# Patient Record
Sex: Female | Born: 1990 | Race: White | Hispanic: No | Marital: Single | Smoking: Current every day smoker
Health system: Southern US, Community
[De-identification: ages and names within clinical notes are randomized; demographics above are authoritative.]

## PROBLEM LIST (undated history)

## (undated) DIAGNOSIS — B1920 Unspecified viral hepatitis C without hepatic coma: Secondary | ICD-10-CM

## (undated) DIAGNOSIS — F1910 Other psychoactive substance abuse, uncomplicated: Secondary | ICD-10-CM

## (undated) DIAGNOSIS — J45909 Unspecified asthma, uncomplicated: Secondary | ICD-10-CM

---

## 2019-06-10 ENCOUNTER — Emergency Department (HOSPITAL_COMMUNITY): Payer: Medicaid - Out of State

## 2019-06-10 ENCOUNTER — Other Ambulatory Visit: Payer: Self-pay

## 2019-06-10 ENCOUNTER — Encounter (HOSPITAL_COMMUNITY): Payer: Self-pay | Admitting: Emergency Medicine

## 2019-06-10 ENCOUNTER — Other Ambulatory Visit: Payer: Self-pay | Admitting: Neurosurgery

## 2019-06-10 ENCOUNTER — Inpatient Hospital Stay (HOSPITAL_COMMUNITY)
Admission: EM | Admit: 2019-06-10 | Discharge: 2019-06-28 | DRG: 981 | Disposition: A | Discharge status: Home Health | Payer: Medicaid - Out of State | Attending: General Surgery | Admitting: General Surgery

## 2019-06-10 DIAGNOSIS — B1920 Unspecified viral hepatitis C without hepatic coma: Secondary | ICD-10-CM | POA: Diagnosis present

## 2019-06-10 DIAGNOSIS — Y92410 Unspecified street and highway as the place of occurrence of the external cause: Secondary | ICD-10-CM

## 2019-06-10 DIAGNOSIS — J189 Pneumonia, unspecified organism: Secondary | ICD-10-CM | POA: Diagnosis not present

## 2019-06-10 DIAGNOSIS — S36116A Major laceration of liver, initial encounter: Secondary | ICD-10-CM | POA: Diagnosis present

## 2019-06-10 DIAGNOSIS — D62 Acute posthemorrhagic anemia: Secondary | ICD-10-CM | POA: Diagnosis present

## 2019-06-10 DIAGNOSIS — E876 Hypokalemia: Secondary | ICD-10-CM | POA: Diagnosis not present

## 2019-06-10 DIAGNOSIS — R509 Fever, unspecified: Secondary | ICD-10-CM

## 2019-06-10 DIAGNOSIS — F418 Other specified anxiety disorders: Secondary | ICD-10-CM | POA: Diagnosis present

## 2019-06-10 DIAGNOSIS — S2241XA Multiple fractures of ribs, right side, initial encounter for closed fracture: Secondary | ICD-10-CM | POA: Diagnosis present

## 2019-06-10 DIAGNOSIS — Z20822 Contact with and (suspected) exposure to covid-19: Secondary | ICD-10-CM | POA: Diagnosis present

## 2019-06-10 DIAGNOSIS — Z4659 Encounter for fitting and adjustment of other gastrointestinal appliance and device: Secondary | ICD-10-CM

## 2019-06-10 DIAGNOSIS — I959 Hypotension, unspecified: Secondary | ICD-10-CM | POA: Diagnosis not present

## 2019-06-10 DIAGNOSIS — N390 Urinary tract infection, site not specified: Secondary | ICD-10-CM | POA: Diagnosis not present

## 2019-06-10 DIAGNOSIS — M48061 Spinal stenosis, lumbar region without neurogenic claudication: Secondary | ICD-10-CM | POA: Diagnosis present

## 2019-06-10 DIAGNOSIS — K567 Ileus, unspecified: Secondary | ICD-10-CM

## 2019-06-10 DIAGNOSIS — R202 Paresthesia of skin: Secondary | ICD-10-CM | POA: Diagnosis not present

## 2019-06-10 DIAGNOSIS — T1490XA Injury, unspecified, initial encounter: Secondary | ICD-10-CM

## 2019-06-10 DIAGNOSIS — J9811 Atelectasis: Secondary | ICD-10-CM | POA: Diagnosis not present

## 2019-06-10 DIAGNOSIS — J9690 Respiratory failure, unspecified, unspecified whether with hypoxia or hypercapnia: Secondary | ICD-10-CM

## 2019-06-10 DIAGNOSIS — S52552A Other extraarticular fracture of lower end of left radius, initial encounter for closed fracture: Secondary | ICD-10-CM | POA: Diagnosis present

## 2019-06-10 DIAGNOSIS — K661 Hemoperitoneum: Secondary | ICD-10-CM | POA: Diagnosis present

## 2019-06-10 DIAGNOSIS — S52612A Displaced fracture of left ulna styloid process, initial encounter for closed fracture: Secondary | ICD-10-CM | POA: Diagnosis present

## 2019-06-10 DIAGNOSIS — S52502A Unspecified fracture of the lower end of left radius, initial encounter for closed fracture: Secondary | ICD-10-CM

## 2019-06-10 DIAGNOSIS — B9620 Unspecified Escherichia coli [E. coli] as the cause of diseases classified elsewhere: Secondary | ICD-10-CM | POA: Diagnosis not present

## 2019-06-10 DIAGNOSIS — Z419 Encounter for procedure for purposes other than remedying health state, unspecified: Secondary | ICD-10-CM

## 2019-06-10 DIAGNOSIS — S0003XA Contusion of scalp, initial encounter: Secondary | ICD-10-CM | POA: Diagnosis present

## 2019-06-10 DIAGNOSIS — J90 Pleural effusion, not elsewhere classified: Secondary | ICD-10-CM

## 2019-06-10 DIAGNOSIS — F17210 Nicotine dependence, cigarettes, uncomplicated: Secondary | ICD-10-CM | POA: Diagnosis present

## 2019-06-10 DIAGNOSIS — S32012A Unstable burst fracture of first lumbar vertebra, initial encounter for closed fracture: Secondary | ICD-10-CM | POA: Diagnosis present

## 2019-06-10 DIAGNOSIS — Z9119 Patient's noncompliance with other medical treatment and regimen: Secondary | ICD-10-CM | POA: Diagnosis not present

## 2019-06-10 DIAGNOSIS — J9601 Acute respiratory failure with hypoxia: Secondary | ICD-10-CM | POA: Diagnosis not present

## 2019-06-10 DIAGNOSIS — F1990 Other psychoactive substance use, unspecified, uncomplicated: Secondary | ICD-10-CM | POA: Diagnosis present

## 2019-06-10 DIAGNOSIS — J939 Pneumothorax, unspecified: Secondary | ICD-10-CM

## 2019-06-10 DIAGNOSIS — Z01818 Encounter for other preprocedural examination: Secondary | ICD-10-CM

## 2019-06-10 DIAGNOSIS — S36031A Moderate laceration of spleen, initial encounter: Secondary | ICD-10-CM | POA: Diagnosis present

## 2019-06-10 HISTORY — DX: Other psychoactive substance abuse, uncomplicated: F19.10

## 2019-06-10 HISTORY — DX: Unspecified asthma, uncomplicated: J45.909

## 2019-06-10 HISTORY — DX: Unspecified viral hepatitis C without hepatic coma: B19.20

## 2019-06-10 LAB — CBC
HCT: 33.5 % — ABNORMAL LOW (ref 36.0–46.0)
HCT: 22.9 % — ABNORMAL LOW (ref 36.0–46.0)
Hemoglobin: 11 g/dL — ABNORMAL LOW (ref 12.0–15.0)
Hemoglobin: 7.8 g/dL — ABNORMAL LOW (ref 12.0–15.0)
MCH: 30 pg (ref 26.0–34.0)
MCH: 30.6 pg (ref 26.0–34.0)
MCHC: 32.8 g/dL (ref 30.0–36.0)
MCHC: 34.1 g/dL (ref 30.0–36.0)
MCV: 91.3 fL (ref 80.0–100.0)
MCV: 89.8 fL (ref 80.0–100.0)
Platelets: 306 10*3/uL (ref 150–400)
Platelets: 171 10*3/uL (ref 150–400)
RBC: 3.67 MIL/uL — ABNORMAL LOW (ref 3.87–5.11)
RBC: 2.55 MIL/uL — ABNORMAL LOW (ref 3.87–5.11)
RDW: 14.2 % (ref 11.5–15.5)
RDW: 14.5 % (ref 11.5–15.5)
WBC: 9 10*3/uL (ref 4.0–10.5)
WBC: 6.9 10*3/uL (ref 4.0–10.5)
nRBC: 0 % (ref 0.0–0.2)
nRBC: 0 % (ref 0.0–0.2)

## 2019-06-10 LAB — COMPREHENSIVE METABOLIC PANEL
ALT: 330 U/L — ABNORMAL HIGH (ref 0–44)
AST: 602 U/L — ABNORMAL HIGH (ref 15–41)
Albumin: 3.1 g/dL — ABNORMAL LOW (ref 3.5–5.0)
Alkaline Phosphatase: 51 U/L (ref 38–126)
Anion gap: 11 (ref 5–15)
BUN: 9 mg/dL (ref 6–20)
CO2: 21 mmol/L — ABNORMAL LOW (ref 22–32)
Calcium: 8.3 mg/dL — ABNORMAL LOW (ref 8.9–10.3)
Chloride: 109 mmol/L (ref 98–111)
Creatinine, Ser: 0.67 mg/dL (ref 0.44–1.00)
GFR calc Af Amer: >60 mL/min (ref >60)
GFR calc non Af Amer: >60 mL/min (ref >60)
Glucose, Bld: 174 mg/dL — ABNORMAL HIGH (ref 70–99)
Potassium: 3.3 mmol/L — ABNORMAL LOW (ref 3.5–5.1)
Sodium: 141 mmol/L (ref 135–145)
Total Bilirubin: 0.5 mg/dL (ref 0.3–1.2)
Total Protein: 6.3 g/dL — ABNORMAL LOW (ref 6.5–8.1)

## 2019-06-10 LAB — I-STAT CHEM 8, ED
BUN: 8 mg/dL (ref 6–20)
Calcium, Ion: 1 mmol/L — ABNORMAL LOW (ref 1.15–1.40)
Chloride: 108 mmol/L (ref 98–111)
Creatinine, Ser: 0.6 mg/dL (ref 0.44–1.00)
Glucose, Bld: 165 mg/dL — ABNORMAL HIGH (ref 70–99)
HCT: 32 % — ABNORMAL LOW (ref 36.0–46.0)
Hemoglobin: 10.9 g/dL — ABNORMAL LOW (ref 12.0–15.0)
Potassium: 3.6 mmol/L (ref 3.5–5.1)
Sodium: 143 mmol/L (ref 135–145)
TCO2: 21 mmol/L — ABNORMAL LOW (ref 22–32)

## 2019-06-10 LAB — I-STAT BETA HCG BLOOD, ED (MC, WL, AP ONLY): I-stat hCG, quantitative: <5 m[IU]/mL (ref <5)

## 2019-06-10 LAB — ETHANOL: Alcohol, Ethyl (B): <10 mg/dL

## 2019-06-10 LAB — SAMPLE TO BLOOD BANK

## 2019-06-10 LAB — PROTIME-INR
INR: 1.2 (ref 0.8–1.2)
Prothrombin Time: 15 seconds (ref 11.4–15.2)

## 2019-06-10 LAB — MRSA PCR SCREENING: MRSA by PCR: POSITIVE — AB

## 2019-06-10 LAB — SARS CORONAVIRUS 2 BY RT PCR (HOSPITAL ORDER, PERFORMED IN ~~LOC~~ HOSPITAL LAB): SARS Coronavirus 2: NEGATIVE

## 2019-06-10 LAB — LACTIC ACID, PLASMA: Lactic Acid, Venous: 1.5 mmol/L (ref 0.5–1.9)

## 2019-06-10 SURGERY — Surgical Case
Anesthesia: *Unknown

## 2019-06-10 MED ORDER — HYDROMORPHONE HCL 1 MG/ML IJ SOLN
0.5000 mg | INTRAMUSCULAR | Status: DC | PRN
  Administered 2019-06-11: 0.5 mg via INTRAVENOUS
  Filled 2019-06-10: qty 1

## 2019-06-10 MED ORDER — DEXMEDETOMIDINE HCL IN NACL 400 MCG/100ML IV SOLN
0.4000 ug/kg/h | INTRAVENOUS | Status: DC
  Administered 2019-06-11 – 2019-06-12 (×3): 0.4 ug/kg/h via INTRAVENOUS
  Administered 2019-06-12 – 2019-06-13 (×2): 1.2 ug/kg/h via INTRAVENOUS
  Administered 2019-06-13 – 2019-06-14 (×3): 1 ug/kg/h via INTRAVENOUS
  Administered 2019-06-14 – 2019-06-15 (×2): 1.2 ug/kg/h via INTRAVENOUS
  Administered 2019-06-15: 0.8 ug/kg/h via INTRAVENOUS
  Administered 2019-06-15 – 2019-06-16 (×2): 1.2 ug/kg/h via INTRAVENOUS
  Administered 2019-06-16 (×2): 1 ug/kg/h via INTRAVENOUS
  Administered 2019-06-17 (×3): 1.2 ug/kg/h via INTRAVENOUS
  Administered 2019-06-17: 1 ug/kg/h via INTRAVENOUS
  Administered 2019-06-18: 1.2 ug/kg/h via INTRAVENOUS
  Administered 2019-06-18: 1 ug/kg/h via INTRAVENOUS
  Filled 2019-06-10: qty 100
  Filled 2019-06-10: qty 300
  Filled 2019-06-10 (×12): qty 100
  Filled 2019-06-10: qty 300
  Filled 2019-06-10 (×2): qty 100
  Filled 2019-06-10: qty 200
  Filled 2019-06-10 (×6): qty 100

## 2019-06-10 MED ORDER — HALOPERIDOL LACTATE 5 MG/ML IJ SOLN: 5.0000 mg | Freq: Four times a day (QID) | INTRAMUSCULAR | Status: DC | PRN

## 2019-06-10 MED ORDER — TETANUS-DIPHTH-ACELL PERTUSSIS 5-2.5-18.5 LF-MCG/0.5 IM SUSP
0.5000 mL | Freq: Once | INTRAMUSCULAR | Status: AC
  Administered 2019-06-10: 0.5 mL via INTRAMUSCULAR
  Filled 2019-06-10: qty 0.5

## 2019-06-10 MED ORDER — SODIUM CHLORIDE 0.9 % IV BOLUS
1000.0000 mL | Freq: Once | INTRAVENOUS | Status: AC
  Administered 2019-06-10: 1000 mL via INTRAVENOUS

## 2019-06-10 MED ORDER — OXYCODONE HCL 5 MG PO TABS
5.0000 mg | ORAL_TABLET | ORAL | Status: DC | PRN
  Administered 2019-06-11 – 2019-06-17 (×3): 5 mg via ORAL
  Filled 2019-06-10 (×4): qty 1

## 2019-06-10 MED ORDER — OXYCODONE HCL 5 MG PO TABS
10.0000 mg | ORAL_TABLET | ORAL | Status: DC | PRN
  Administered 2019-06-11 – 2019-06-18 (×6): 10 mg via ORAL
  Filled 2019-06-10 (×7): qty 2

## 2019-06-10 MED ORDER — MUPIROCIN 2 % EX OINT
1.0000 "application " | TOPICAL_OINTMENT | Freq: Two times a day (BID) | CUTANEOUS | Status: AC
  Administered 2019-06-10 – 2019-06-15 (×10): 1 via NASAL
  Filled 2019-06-10: qty 22

## 2019-06-10 MED ORDER — IOHEXOL 300 MG/ML  SOLN
100.0000 mL | Freq: Once | INTRAMUSCULAR | Status: AC | PRN
  Administered 2019-06-10: 100 mL via INTRAVENOUS

## 2019-06-10 MED ORDER — LACTATED RINGERS IV SOLN: INTRAVENOUS | Status: DC

## 2019-06-10 MED ORDER — METHOCARBAMOL 1000 MG/10ML IJ SOLN
1000.0000 mg | Freq: Three times a day (TID) | INTRAVENOUS | Status: DC
  Administered 2019-06-10 – 2019-06-21 (×32): 1000 mg via INTRAVENOUS
  Filled 2019-06-10 (×38): qty 10

## 2019-06-10 MED ORDER — ONDANSETRON HCL 4 MG/2ML IJ SOLN
4.0000 mg | Freq: Once | INTRAMUSCULAR | Status: AC
  Administered 2019-06-10: 4 mg via INTRAVENOUS
  Filled 2019-06-10: qty 2

## 2019-06-10 MED ORDER — SODIUM CHLORIDE 0.9 % IV SOLN: INTRAVENOUS | Status: DC

## 2019-06-10 MED ORDER — ONDANSETRON 4 MG PO TBDP: 4.0000 mg | ORAL_TABLET | Freq: Four times a day (QID) | ORAL | Status: DC | PRN

## 2019-06-10 MED ORDER — METOPROLOL TARTRATE 5 MG/5ML IV SOLN: 5.0000 mg | Freq: Four times a day (QID) | INTRAVENOUS | Status: DC | PRN

## 2019-06-10 MED ORDER — HYDROMORPHONE HCL 1 MG/ML IJ SOLN
2.0000 mg | INTRAMUSCULAR | Status: DC | PRN
  Administered 2019-06-10 – 2019-06-17 (×14): 2 mg via INTRAVENOUS
  Administered 2019-06-17: 1 mg via INTRAVENOUS
  Administered 2019-06-17 – 2019-06-18 (×5): 2 mg via INTRAVENOUS
  Filled 2019-06-10 (×20): qty 2

## 2019-06-10 MED ORDER — ALPRAZOLAM 0.5 MG PO TABS: 1.0000 mg | ORAL_TABLET | Freq: Two times a day (BID) | ORAL | Status: DC

## 2019-06-10 MED ORDER — HYDROMORPHONE HCL 1 MG/ML IJ SOLN
0.5000 mg | Freq: Once | INTRAMUSCULAR | Status: AC
  Administered 2019-06-10: 0.5 mg via INTRAVENOUS
  Filled 2019-06-10: qty 1

## 2019-06-10 MED ORDER — POTASSIUM CHLORIDE 10 MEQ/100ML IV SOLN
10.0000 meq | INTRAVENOUS | Status: AC
  Administered 2019-06-10 (×2): 10 meq via INTRAVENOUS
  Filled 2019-06-10: qty 100

## 2019-06-10 MED ORDER — HYDROMORPHONE HCL 1 MG/ML IJ SOLN
1.0000 mg | INTRAMUSCULAR | Status: DC | PRN
  Administered 2019-06-11 – 2019-06-17 (×7): 1 mg via INTRAVENOUS
  Filled 2019-06-10 (×10): qty 1

## 2019-06-10 MED ORDER — TRAMADOL HCL 50 MG PO TABS
50.0000 mg | ORAL_TABLET | Freq: Four times a day (QID) | ORAL | Status: DC | PRN
  Administered 2019-06-11 (×3): 50 mg via ORAL
  Filled 2019-06-10 (×3): qty 1

## 2019-06-10 MED ORDER — PANTOPRAZOLE SODIUM 40 MG IV SOLR: 40.0000 mg | Freq: Every day | INTRAVENOUS | Status: DC

## 2019-06-10 MED ORDER — DOCUSATE SODIUM 100 MG PO CAPS
100.0000 mg | ORAL_CAPSULE | Freq: Two times a day (BID) | ORAL | Status: DC
  Administered 2019-06-11 – 2019-06-12 (×3): 100 mg via ORAL
  Filled 2019-06-10 (×3): qty 1

## 2019-06-10 MED ORDER — ONDANSETRON HCL 4 MG/2ML IJ SOLN
4.0000 mg | Freq: Four times a day (QID) | INTRAMUSCULAR | Status: DC | PRN
  Administered 2019-06-11 – 2019-06-24 (×7): 4 mg via INTRAVENOUS
  Filled 2019-06-10 (×6): qty 2

## 2019-06-10 MED ORDER — CHLORHEXIDINE GLUCONATE CLOTH 2 % EX PADS
6.0000 | MEDICATED_PAD | Freq: Every day | CUTANEOUS | Status: AC
  Administered 2019-06-11 – 2019-06-14 (×4): 6 via TOPICAL

## 2019-06-10 MED ORDER — LIDOCAINE HCL 1 % IJ SOLN
10.0000 mL | Freq: Once | INTRAMUSCULAR | Status: AC
  Administered 2019-06-10: 10 mL
  Filled 2019-06-10: qty 10

## 2019-06-10 MED ORDER — PANTOPRAZOLE SODIUM 40 MG PO TBEC: 40.0000 mg | DELAYED_RELEASE_TABLET | Freq: Every day | ORAL | Status: DC

## 2019-06-10 MED ORDER — LORAZEPAM 2 MG/ML IJ SOLN
1.0000 mg | Freq: Four times a day (QID) | INTRAMUSCULAR | Status: DC | PRN
  Administered 2019-06-10 – 2019-06-17 (×7): 1 mg via INTRAVENOUS
  Filled 2019-06-10 (×8): qty 1

## 2019-06-10 NOTE — Progress Notes (Signed)
Orthopedic Tech Progress Note Patient Details:  Kelly Garrett Aug 09, 1990 [id number]  Ortho Devices Type of Ortho Device: Sugartong splint Ortho Device/Splint Location: Left Upper Extremity Ortho Device/Splint Interventions: Ordered, Application   Post Interventions Patient Tolerated: Fair Instructions Provided: Adjustment of device, Care of device, Poper ambulation with device   Gerald Stabs 06/10/2019, 12:53 PM

## 2019-06-10 NOTE — Progress Notes (Signed)
Orthopedic Tech Progress Note Patient Details:  Kelly Garrett 05-28-1990 [id number] Called in patients brace Patient ID: Kelly Garrett, female   DOB: 1990/08/02, 29 y.o.   MRN: [id number]   Kelly Garrett 06/10/2019, 1:45 PM

## 2019-06-10 NOTE — Progress Notes (Signed)
Belongings on admission: One bottle of xanax taken to pharmacy One empty pill bottle I pair earrings 2 silver rings 1 black purse 1 smaller pink purse Phone  Black wallet  Bra Shoes Cash going to security: four 100 dollar bills            One 20 dollar bill           One 10 dollar bill           Three 1 dollar bills             One quarter Other valuables going to security: 2 lighters, 2 vape pens
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
## 2019-06-10 NOTE — ED Notes (Signed)
Paged ortho tech to place long arm splint
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
## 2019-06-10 NOTE — Progress Notes (Signed)
Pt found to be sitting up in bed. Requested patient to lay back in bed explained extent of back injuries and risks associated with moving excessively.
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
## 2019-06-10 NOTE — Plan of Care (Signed)
  Problem: Safety: Goal: Ability to remain free from injury will improve Outcome: Progressing   
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
## 2019-06-10 NOTE — Consult Note (Addendum)
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 patient's cervical CT performed at [facility] today.  She has a mildly straightened cervical spine but no acute findings.  I have also reviewed the patient's CT of the chest abdomen and pelvis only as it pertains to her spine.  The patient has an L1 burst fracture with moderate spinal stenosis.  Assessment/Plan: L1 burst fracture: This is an unstable fracture which will require surgical decompression, instrumentation and fusion.  I discussed the situation with Dr. [facility].  I am reluctant to place the patient under general anesthesia and to turn the patient prone for a few hours for spine surgery now with the known liver lacerations and splenic lacerations.  She will require at least 48 hours of observation in the supine position.  We will plan to do her surgery next week.  In the meantime she can be treated with a clamshell TLSO and logroll/bedrest with the head of bed less than 20 degrees.  I have discussed this  with Dr. [facility] and Dr. Bedelia Person.  I discussed this with the patient and stressed the importance of her following the directions, remaining flat, etc. I have explained to her that she does not follow  our directions she could end up dead or paralyzed.  Kelly Garrett 06/10/2019, 1:32 PM

## 2019-06-11 ENCOUNTER — Inpatient Hospital Stay (HOSPITAL_COMMUNITY): Payer: Medicaid - Out of State

## 2019-06-11 LAB — ABO/RH: ABO/RH(D): A NEG

## 2019-06-11 LAB — COMPREHENSIVE METABOLIC PANEL
ALT: 331 U/L — ABNORMAL HIGH (ref 0–44)
AST: 463 U/L — ABNORMAL HIGH (ref 15–41)
Albumin: 2.7 g/dL — ABNORMAL LOW (ref 3.5–5.0)
Alkaline Phosphatase: 55 U/L (ref 38–126)
Anion gap: 8 (ref 5–15)
BUN: 13 mg/dL (ref 6–20)
CO2: 22 mmol/L (ref 22–32)
Calcium: 7.9 mg/dL — ABNORMAL LOW (ref 8.9–10.3)
Chloride: 107 mmol/L (ref 98–111)
Creatinine, Ser: 0.49 mg/dL (ref 0.44–1.00)
GFR calc Af Amer: >60 mL/min (ref >60)
GFR calc non Af Amer: >60 mL/min (ref >60)
Glucose, Bld: 118 mg/dL — ABNORMAL HIGH (ref 70–99)
Potassium: 3.8 mmol/L (ref 3.5–5.1)
Sodium: 137 mmol/L (ref 135–145)
Total Bilirubin: 1.8 mg/dL — ABNORMAL HIGH (ref 0.3–1.2)
Total Protein: 5.3 g/dL — ABNORMAL LOW (ref 6.5–8.1)

## 2019-06-11 LAB — CBC
HCT: 22.7 % — ABNORMAL LOW (ref 36.0–46.0)
HCT: 25.4 % — ABNORMAL LOW (ref 36.0–46.0)
Hemoglobin: 7.4 g/dL — ABNORMAL LOW (ref 12.0–15.0)
Hemoglobin: 8.6 g/dL — ABNORMAL LOW (ref 12.0–15.0)
MCH: 30.1 pg (ref 26.0–34.0)
MCH: 30.2 pg (ref 26.0–34.0)
MCHC: 32.6 g/dL (ref 30.0–36.0)
MCHC: 33.9 g/dL (ref 30.0–36.0)
MCV: 92.3 fL (ref 80.0–100.0)
MCV: 89.1 fL (ref 80.0–100.0)
Platelets: 154 10*3/uL (ref 150–400)
Platelets: 139 10*3/uL — ABNORMAL LOW (ref 150–400)
RBC: 2.46 MIL/uL — ABNORMAL LOW (ref 3.87–5.11)
RBC: 2.85 MIL/uL — ABNORMAL LOW (ref 3.87–5.11)
RDW: 14.6 % (ref 11.5–15.5)
RDW: 14.6 % (ref 11.5–15.5)
WBC: 6.3 10*3/uL (ref 4.0–10.5)
WBC: 8.6 10*3/uL (ref 4.0–10.5)
nRBC: 0 % (ref 0.0–0.2)
nRBC: 0 % (ref 0.0–0.2)

## 2019-06-11 LAB — URINALYSIS, ROUTINE W REFLEX MICROSCOPIC
Glucose, UA: NEGATIVE mg/dL
Hgb urine dipstick: NEGATIVE
Ketones, ur: NEGATIVE mg/dL
Leukocytes,Ua: NEGATIVE
Nitrite: POSITIVE — AB
Protein, ur: 30 mg/dL — AB
Specific Gravity, Urine: 1.035 — ABNORMAL HIGH (ref 1.005–1.030)
pH: 5 (ref 5.0–8.0)

## 2019-06-11 LAB — RAPID URINE DRUG SCREEN, HOSP PERFORMED
Amphetamines: NOT DETECTED
Barbiturates: NOT DETECTED
Benzodiazepines: POSITIVE — AB
Cocaine: NOT DETECTED
Opiates: POSITIVE — AB
Tetrahydrocannabinol: NOT DETECTED

## 2019-06-11 LAB — HEMOGLOBIN AND HEMATOCRIT, BLOOD
HCT: 26.8 % — ABNORMAL LOW (ref 36.0–46.0)
Hemoglobin: 9.2 g/dL — ABNORMAL LOW (ref 12.0–15.0)

## 2019-06-11 LAB — HIV ANTIBODY (ROUTINE TESTING W REFLEX): HIV Screen 4th Generation wRfx: NONREACTIVE

## 2019-06-11 LAB — PROTIME-INR
INR: 1.1 (ref 0.8–1.2)
Prothrombin Time: 14.2 seconds (ref 11.4–15.2)

## 2019-06-11 LAB — PREPARE RBC (CROSSMATCH)

## 2019-06-11 MED ORDER — CLONIDINE HCL 0.1 MG PO TABS
0.1000 mg | ORAL_TABLET | Freq: Two times a day (BID) | ORAL | Status: DC
  Administered 2019-06-11: 0.1 mg via ORAL
  Filled 2019-06-11 (×3): qty 1

## 2019-06-11 MED ORDER — BACLOFEN 10 MG PO TABS
10.0000 mg | ORAL_TABLET | Freq: Four times a day (QID) | ORAL | Status: DC
  Administered 2019-06-11 – 2019-06-12 (×4): 10 mg via ORAL
  Filled 2019-06-11 (×7): qty 1

## 2019-06-11 MED ORDER — GABAPENTIN 300 MG PO CAPS
600.0000 mg | ORAL_CAPSULE | Freq: Three times a day (TID) | ORAL | Status: DC
  Administered 2019-06-11 – 2019-06-12 (×3): 600 mg via ORAL
  Filled 2019-06-11 (×4): qty 2

## 2019-06-11 MED ORDER — RISPERIDONE 0.5 MG PO TABS
0.5000 mg | ORAL_TABLET | Freq: Two times a day (BID) | ORAL | Status: DC
  Administered 2019-06-11 – 2019-06-12 (×2): 0.5 mg via ORAL
  Filled 2019-06-11 (×4): qty 1

## 2019-06-11 MED ORDER — PHENOBARBITAL 32.4 MG PO TABS: 16.2000 mg | ORAL_TABLET | Freq: Every day | ORAL | Status: DC

## 2019-06-11 MED ORDER — PANTOPRAZOLE SODIUM 40 MG PO TBEC
40.0000 mg | DELAYED_RELEASE_TABLET | Freq: Every day | ORAL | Status: DC
  Administered 2019-06-11 – 2019-06-12 (×2): 40 mg via ORAL
  Filled 2019-06-11 (×2): qty 1

## 2019-06-11 MED ORDER — ALPRAZOLAM 0.5 MG PO TABS
2.0000 mg | ORAL_TABLET | Freq: Two times a day (BID) | ORAL | Status: DC | PRN
  Administered 2019-06-11 – 2019-06-17 (×3): 2 mg via ORAL
  Filled 2019-06-11 (×4): qty 4

## 2019-06-11 MED ORDER — MONTELUKAST SODIUM 10 MG PO TABS
10.0000 mg | ORAL_TABLET | Freq: Every day | ORAL | Status: DC
  Administered 2019-06-11 – 2019-06-12 (×2): 10 mg via ORAL
  Filled 2019-06-11 (×3): qty 1

## 2019-06-11 MED ORDER — QUETIAPINE FUMARATE 25 MG PO TABS
25.0000 mg | ORAL_TABLET | Freq: Every day | ORAL | Status: DC
  Administered 2019-06-11: 25 mg via ORAL
  Filled 2019-06-11 (×2): qty 1

## 2019-06-11 MED ORDER — BUPRENORPHINE HCL-NALOXONE HCL 8-2 MG SL SUBL: 1.0000 | SUBLINGUAL_TABLET | Freq: Every day | SUBLINGUAL | Status: DC

## 2019-06-11 MED ORDER — ALBUTEROL SULFATE (2.5 MG/3ML) 0.083% IN NEBU
2.5000 mg | INHALATION_SOLUTION | RESPIRATORY_TRACT | Status: DC | PRN
  Administered 2019-06-27 (×2): 2.5 mg via RESPIRATORY_TRACT
  Filled 2019-06-11 (×2): qty 3

## 2019-06-11 MED ORDER — BUPROPION HCL ER (SR) 100 MG PO TB12
200.0000 mg | ORAL_TABLET | Freq: Two times a day (BID) | ORAL | Status: DC
  Administered 2019-06-11 – 2019-06-12 (×2): 200 mg via ORAL
  Filled 2019-06-11 (×5): qty 2

## 2019-06-11 MED ORDER — ALBUTEROL SULFATE HFA 108 (90 BASE) MCG/ACT IN AERS: 2.0000 | INHALATION_SPRAY | RESPIRATORY_TRACT | Status: DC

## 2019-06-11 MED ORDER — OXYBUTYNIN CHLORIDE 5 MG PO TABS
10.0000 mg | ORAL_TABLET | Freq: Every evening | ORAL | Status: DC
  Administered 2019-06-11: 10 mg via ORAL
  Filled 2019-06-11 (×2): qty 2

## 2019-06-11 MED ORDER — SODIUM CHLORIDE 0.9% IV SOLUTION: Freq: Once | INTRAVENOUS | Status: AC

## 2019-06-11 MED ORDER — BUPRENORPHINE HCL 8 MG SL SUBL
8.0000 mg | SUBLINGUAL_TABLET | Freq: Every day | SUBLINGUAL | Status: DC
  Administered 2019-06-14 – 2019-06-26 (×6): 8 mg via SUBLINGUAL
  Filled 2019-06-11 (×10): qty 1
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
## 2019-06-11 NOTE — Progress Notes (Signed)
Pt's mother, Britta Mccreedy called and updated. Pt expresses appreciation for being able to speak with her mother and give her an update.

## 2019-06-11 NOTE — TOC CAGE-AID Note (Signed)
Transition of Care [facility]) - CAGE-AID Screening   Patient Details  Name: Kelly Garrett MRN: [id number] Date of Birth: [birth year]  [facility]) CM/SW Contact:    Jimmy Picket, LCSWA Phone Number: 06/11/2019, 10:53 AM   Clinical Narrative:  Pt denied alcohol and substance use.   CAGE-AID Screening:    Have You Ever Felt You Ought to Cut Down on Your Drinking or Drug Use?: No Have People Annoyed You By Critizing Your Drinking Or Drug Use?: No Have You Felt Bad Or Guilty About Your Drinking Or Drug Use?: No Have You Ever Had a Drink or Used Drugs First Thing In The Morning to STeady Your Nerves or to Get Rid of a Hangover?: No CAGE-AID Score: 0  Substance Abuse Education Offered: No    Denita Lung, Bridget Hartshorn Clinical Social Worker [phone]

## 2019-06-11 NOTE — Progress Notes (Signed)
Pt states that she feels very anxious and is in a lot of pain. Pt asked if it was time for more dilaudid to be given. RN educated pt that it is not yet time, as it was just administered and she needs to give it some time to take effect. Pt states that she is extremely anxious. RN will start precedex gtt at this time.

## 2019-06-11 NOTE — Progress Notes (Signed)
Blood consent form completed, RN received consent from both the patient and patient's mother, Dorthia Tout via phone. Telephone consent witnessed by 2nd RN, [employer].
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
## 2019-06-11 NOTE — Progress Notes (Signed)
Pt continues to complain of pain in her ribs and back, she states"nothing is touching me" and does not feel any pain relief. Pt states she takes Excedrin at home for cramps and a Xanax. She asks if she can leave AMA to get her home pain meds. RN reminded pt of the severity of her fxs and re-iterated that the Neurosurgical and Trauma MDs have explained that she may end up paralyzed if she is unable to follow the orders to stay flat and in bed. Pt states "she does not care about that" and that she "would rather be paralyzed without any pain than to have to lie like this". All PRN pain meds have been given at this time. RN will give her scheduled Robaxin IV and will reach out to Trauma MD to update her about the pt's complaints of pain. Pt states she will attempt to wait for the next dose of medication she can receive.
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
## 2019-06-11 NOTE — Progress Notes (Signed)
Pt is screaming from pt room at 0704 at this time, she states that she is in immense pain and is having difficulty tolerating lying flat. Night shift RN states that this is the first time she is screaming out like that, shift RN states she is able to have Ativan at this time as it is a PRN med on her list. Pt VSS. RN quickly finished receiving report on the patient and went to remove Ativan from Pyxis. The Ativan vial was removed from the Pyxis, the count was correct according to the [facility] inventory.13 Vials were present (1 bag of 10 vials and 3 separate vials). One vials was removed to draw up the medication. RN placed the vial on top of the pyxis and went to grab a syringe to draw up the medication. At that time another RN was called to witness the waste of 1mg  ativan. The drawer was closed as the vial rested on top of the pyxis to be wasted with both RNs present in the med room.  Instantly after closing the med drawer both RNs realized that the top of the medication had already been removed and an empty vial had been placed in the pyxis drawer (so there was no medication available to be drawn up to be wasted). A second vial of Ativan was taken out of the Pyxis so that the medication could be drawn up and given to the patient who was still screaming from the room). The empty Ativan vial was set aside and another vial was removed from the Pyxis. There were 12 vials in the med drawer and then 1 vial was removed to leave 11 vials remaining. So in total, 2 vials were taken out of the Pyxis but only 1 vial was used to administer the medication. Both shift RNs Maisie Fus and Ranelle Oyster) alerted the 2 charge RNs immediately of the encounter (night shift RN, Gari Crown and the day shift charge RN, Ammie Ferrier) as well as the unit AD, Carilyn Goodpasture. In addition a SZP will be completed and Pharmacy alerted to the situation. Pt received 1 mg of Ativan by Ferman Hamming and is resting comfortably.

## 2019-06-12 ENCOUNTER — Inpatient Hospital Stay (HOSPITAL_COMMUNITY): Payer: Medicaid - Out of State | Admitting: Anesthesiology

## 2019-06-12 ENCOUNTER — Inpatient Hospital Stay: Payer: Self-pay

## 2019-06-12 ENCOUNTER — Inpatient Hospital Stay (HOSPITAL_COMMUNITY): Payer: Medicaid - Out of State

## 2019-06-12 LAB — COMPREHENSIVE METABOLIC PANEL
ALT: 246 U/L — ABNORMAL HIGH (ref 0–44)
AST: 189 U/L — ABNORMAL HIGH (ref 15–41)
Albumin: 2.8 g/dL — ABNORMAL LOW (ref 3.5–5.0)
Alkaline Phosphatase: 71 U/L (ref 38–126)
Anion gap: 9 (ref 5–15)
BUN: 11 mg/dL (ref 6–20)
CO2: 22 mmol/L (ref 22–32)
Calcium: 8 mg/dL — ABNORMAL LOW (ref 8.9–10.3)
Chloride: 108 mmol/L (ref 98–111)
Creatinine, Ser: 0.57 mg/dL (ref 0.44–1.00)
GFR calc Af Amer: >60 mL/min (ref >60)
GFR calc non Af Amer: >60 mL/min (ref >60)
Glucose, Bld: 85 mg/dL (ref 70–99)
Potassium: 3.5 mmol/L (ref 3.5–5.1)
Sodium: 139 mmol/L (ref 135–145)
Total Bilirubin: 1.3 mg/dL — ABNORMAL HIGH (ref 0.3–1.2)
Total Protein: 6.1 g/dL — ABNORMAL LOW (ref 6.5–8.1)

## 2019-06-12 LAB — POCT I-STAT 7, (LYTES, BLD GAS, ICA,H+H)
Acid-base deficit: 4 mmol/L — ABNORMAL HIGH (ref 0.0–2.0)
Bicarbonate: 21.6 mmol/L (ref 20.0–28.0)
Calcium, Ion: 1.17 mmol/L (ref 1.15–1.40)
HCT: 22 % — ABNORMAL LOW (ref 36.0–46.0)
Hemoglobin: 7.5 g/dL — ABNORMAL LOW (ref 12.0–15.0)
O2 Saturation: 74 %
Patient temperature: 100.8
Potassium: 3.3 mmol/L — ABNORMAL LOW (ref 3.5–5.1)
Sodium: 141 mmol/L (ref 135–145)
TCO2: 23 mmol/L (ref 22–32)
pCO2 arterial: 42.2 mmHg (ref 32.0–48.0)
pH, Arterial: 7.322 — ABNORMAL LOW (ref 7.350–7.450)
pO2, Arterial: 45 mmHg — ABNORMAL LOW (ref 83.0–108.0)

## 2019-06-12 LAB — URINALYSIS, COMPLETE (UACMP) WITH MICROSCOPIC
Glucose, UA: NEGATIVE mg/dL
Ketones, ur: 5 mg/dL — AB
Nitrite: POSITIVE — AB
Protein, ur: 100 mg/dL — AB
RBC / HPF: >50 RBC/hpf — ABNORMAL HIGH (ref 0–5)
Specific Gravity, Urine: 1.023 (ref 1.005–1.030)
WBC, UA: >50 WBC/hpf — ABNORMAL HIGH (ref 0–5)
pH: 5 (ref 5.0–8.0)

## 2019-06-12 LAB — CBC
HCT: 22.5 % — ABNORMAL LOW (ref 36.0–46.0)
Hemoglobin: 7.4 g/dL — ABNORMAL LOW (ref 12.0–15.0)
MCH: 30.1 pg (ref 26.0–34.0)
MCHC: 32.9 g/dL (ref 30.0–36.0)
MCV: 91.5 fL (ref 80.0–100.0)
Platelets: 127 10*3/uL — ABNORMAL LOW (ref 150–400)
RBC: 2.46 MIL/uL — ABNORMAL LOW (ref 3.87–5.11)
RDW: 14.4 % (ref 11.5–15.5)
WBC: 5.9 10*3/uL (ref 4.0–10.5)
nRBC: 0 % (ref 0.0–0.2)

## 2019-06-12 LAB — PREPARE RBC (CROSSMATCH)

## 2019-06-12 MED ORDER — ALBUMIN HUMAN 5 % IV SOLN
25.0000 g | Freq: Once | INTRAVENOUS | Status: AC
  Administered 2019-06-12: 25 g via INTRAVENOUS
  Filled 2019-06-12: qty 500

## 2019-06-12 MED ORDER — SUCCINYLCHOLINE CHLORIDE 20 MG/ML IJ SOLN
INTRAMUSCULAR | Status: DC | PRN
  Administered 2019-06-12: 70 mg via INTRAVENOUS

## 2019-06-12 MED ORDER — MIDAZOLAM HCL 2 MG/2ML IJ SOLN
INTRAMUSCULAR | Status: AC
  Administered 2019-06-12: 2 mg
  Filled 2019-06-12: qty 2

## 2019-06-12 MED ORDER — VANCOMYCIN HCL 1500 MG/300ML IV SOLN
1500.0000 mg | Freq: Once | INTRAVENOUS | Status: AC
  Administered 2019-06-12: 1500 mg via INTRAVENOUS
  Filled 2019-06-12: qty 300

## 2019-06-12 MED ORDER — GABAPENTIN 300 MG PO CAPS: 600.0000 mg | ORAL_CAPSULE | Freq: Three times a day (TID) | ORAL | Status: DC

## 2019-06-12 MED ORDER — ORAL CARE MOUTH RINSE
15.0000 mL | OROMUCOSAL | Status: DC
  Administered 2019-06-12 – 2019-06-17 (×44): 15 mL via OROMUCOSAL

## 2019-06-12 MED ORDER — DOCUSATE SODIUM 50 MG/5ML PO LIQD
100.0000 mg | Freq: Two times a day (BID) | ORAL | Status: DC
  Administered 2019-06-13 – 2019-06-17 (×7): 100 mg
  Filled 2019-06-12 (×7): qty 10

## 2019-06-12 MED ORDER — PANTOPRAZOLE SODIUM 40 MG PO PACK
40.0000 mg | PACK | Freq: Every day | ORAL | Status: DC
  Administered 2019-06-13 – 2019-06-17 (×4): 40 mg
  Filled 2019-06-12 (×4): qty 20

## 2019-06-12 MED ORDER — CHLORHEXIDINE GLUCONATE 0.12% ORAL RINSE (MEDLINE KIT)
15.0000 mL | Freq: Two times a day (BID) | OROMUCOSAL | Status: DC
  Administered 2019-06-12 – 2019-06-17 (×9): 15 mL via OROMUCOSAL

## 2019-06-12 MED ORDER — GABAPENTIN 250 MG/5ML PO SOLN
600.0000 mg | Freq: Three times a day (TID) | ORAL | Status: DC
  Administered 2019-06-12 – 2019-06-17 (×12): 600 mg
  Filled 2019-06-12 (×17): qty 12

## 2019-06-12 MED ORDER — PIPERACILLIN-TAZOBACTAM 3.375 G IVPB
3.3750 g | Freq: Three times a day (TID) | INTRAVENOUS | Status: DC
  Administered 2019-06-12 – 2019-06-15 (×10): 3.375 g via INTRAVENOUS
  Filled 2019-06-12 (×10): qty 50

## 2019-06-12 MED ORDER — PROPOFOL 10 MG/ML IV BOLUS
INTRAVENOUS | Status: DC | PRN
  Administered 2019-06-12: 100 mg via INTRAVENOUS
  Administered 2019-06-12 (×2): 50 mg via INTRAVENOUS

## 2019-06-12 MED ORDER — BACLOFEN 10 MG PO TABS
10.0000 mg | ORAL_TABLET | Freq: Four times a day (QID) | ORAL | Status: DC
  Administered 2019-06-12 – 2019-06-17 (×15): 10 mg
  Filled 2019-06-12 (×18): qty 1

## 2019-06-12 MED ORDER — FENTANYL 2500MCG IN NS 250ML (10MCG/ML) PREMIX INFUSION
0.0000 ug/h | INTRAVENOUS | Status: DC
  Administered 2019-06-12: 50 ug/h via INTRAVENOUS
  Administered 2019-06-13: 150 ug/h via INTRAVENOUS
  Administered 2019-06-13: 200 ug/h via INTRAVENOUS
  Administered 2019-06-14: 300 ug/h via INTRAVENOUS
  Administered 2019-06-14 (×2): 250 ug/h via INTRAVENOUS
  Administered 2019-06-15: 300 ug/h via INTRAVENOUS
  Administered 2019-06-16: 275 ug/h via INTRAVENOUS
  Administered 2019-06-16: 300 ug/h via INTRAVENOUS
  Administered 2019-06-16: 250 ug/h via INTRAVENOUS
  Administered 2019-06-17: 350 ug/h via INTRAVENOUS
  Filled 2019-06-12 (×11): qty 250

## 2019-06-12 MED ORDER — FENTANYL BOLUS VIA INFUSION
50.0000 ug | INTRAVENOUS | Status: DC | PRN
  Filled 2019-06-12: qty 50

## 2019-06-12 MED ORDER — RISPERIDONE 0.5 MG PO TABS
0.5000 mg | ORAL_TABLET | Freq: Two times a day (BID) | ORAL | Status: DC
  Administered 2019-06-12 – 2019-06-17 (×10): 0.5 mg
  Filled 2019-06-12 (×10): qty 1

## 2019-06-12 MED ORDER — FENTANYL CITRATE (PF) 100 MCG/2ML IJ SOLN
INTRAMUSCULAR | Status: AC
  Administered 2019-06-12: 100 ug
  Filled 2019-06-12: qty 2

## 2019-06-12 MED ORDER — CLONIDINE HCL 0.1 MG PO TABS
0.1000 mg | ORAL_TABLET | Freq: Two times a day (BID) | ORAL | Status: DC
  Administered 2019-06-12 – 2019-06-17 (×9): 0.1 mg
  Filled 2019-06-12 (×8): qty 1

## 2019-06-12 MED ORDER — LIDOCAINE HCL (CARDIAC) PF 100 MG/5ML IV SOSY
PREFILLED_SYRINGE | INTRAVENOUS | Status: DC | PRN
  Administered 2019-06-12: 60 mg via INTRAVENOUS

## 2019-06-12 MED ORDER — VANCOMYCIN HCL 1250 MG/250ML IV SOLN
1250.0000 mg | Freq: Two times a day (BID) | INTRAVENOUS | Status: DC
  Administered 2019-06-13 – 2019-06-15 (×6): 1250 mg via INTRAVENOUS
  Filled 2019-06-12 (×7): qty 250

## 2019-06-12 MED ORDER — QUETIAPINE FUMARATE 25 MG PO TABS
25.0000 mg | ORAL_TABLET | Freq: Every day | ORAL | Status: DC
  Administered 2019-06-12 – 2019-06-16 (×5): 25 mg
  Filled 2019-06-12 (×4): qty 1

## 2019-06-12 MED ORDER — SODIUM CHLORIDE 0.9% IV SOLUTION: Freq: Once | INTRAVENOUS | Status: AC

## 2019-06-12 NOTE — Progress Notes (Signed)
Patient ID: Kelly Garrett, female   DOB: [birth year], 29 y.o.   MRN: [id number] Arouses easily and wiggles toes to command.  I suspect she may be scheduled for surgery on Monday with Dr. Lovell Sheehan
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
## 2019-06-12 NOTE — Anesthesia Procedure Notes (Signed)
Procedure Name: Intubation Date/Time: 06/12/2019 4:44 PM Performed by: Adria Dill, CRNA Pre-anesthesia Checklist: Patient identified, Emergency Drugs available, Suction available and Patient being monitored Patient Re-evaluated:Patient Re-evaluated prior to induction Oxygen Delivery Method: Circle system utilized Preoxygenation: Pre-oxygenation with 100% oxygen Induction Type: IV induction Ventilation: Mask ventilation without difficulty Laryngoscope Size: Miller and 2 Grade View: Grade I Tube type: Oral Tube size: 7.5 mm Number of attempts: 1 Airway Equipment and Method: Stylet and Oral airway Placement Confirmation: ETT inserted through vocal cords under direct vision,  positive ETCO2 and breath sounds checked- equal and bilateral Secured at: 21 cm Tube secured with: Tape Dental Injury: Teeth and Oropharynx as per pre-operative assessment

## 2019-06-12 NOTE — Progress Notes (Signed)
Verbal order received from Dr. Donell Beers to have IV team place PICC. Also confirmed with MD to hold on PRBC until new CBC can be drawn. BP currently 121/75.
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
## 2019-06-12 NOTE — Progress Notes (Signed)
Patient keeps removing oxygen mask. Patient educated and mask replaced. Will continue to monitor.

## 2019-06-12 NOTE — Progress Notes (Signed)
CT with good bronchograms, but near total atelectasis of right lung.  Given slow drop in sats and increased HR, will plan intubation for pulmonary toilet.  Plan lavage and suctioning.  If no improvement, might need bronch tomorrow.    Discussed situation with nurse and mother who is in [place].    Anesthesia will plan to come intubate.
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
## 2019-06-13 ENCOUNTER — Inpatient Hospital Stay (HOSPITAL_COMMUNITY): Payer: Medicaid - Out of State

## 2019-06-13 LAB — COMPREHENSIVE METABOLIC PANEL
ALT: 154 U/L — ABNORMAL HIGH (ref 0–44)
AST: 88 U/L — ABNORMAL HIGH (ref 15–41)
Albumin: 2.3 g/dL — ABNORMAL LOW (ref 3.5–5.0)
Alkaline Phosphatase: 62 U/L (ref 38–126)
Anion gap: 12 (ref 5–15)
BUN: 13 mg/dL (ref 6–20)
CO2: 18 mmol/L — ABNORMAL LOW (ref 22–32)
Calcium: 7.7 mg/dL — ABNORMAL LOW (ref 8.9–10.3)
Chloride: 108 mmol/L (ref 98–111)
Creatinine, Ser: 0.72 mg/dL (ref 0.44–1.00)
GFR calc Af Amer: >60 mL/min (ref >60)
GFR calc non Af Amer: >60 mL/min (ref >60)
Glucose, Bld: 97 mg/dL (ref 70–99)
Potassium: 3.2 mmol/L — ABNORMAL LOW (ref 3.5–5.1)
Sodium: 138 mmol/L (ref 135–145)
Total Bilirubin: 1.8 mg/dL — ABNORMAL HIGH (ref 0.3–1.2)
Total Protein: 5.2 g/dL — ABNORMAL LOW (ref 6.5–8.1)

## 2019-06-13 LAB — TYPE AND SCREEN
ABO/RH(D): A NEG
Antibody Screen: NEGATIVE
Unit division: 0
Unit division: 0
Unit division: 0

## 2019-06-13 LAB — BPAM RBC
Blood Product Expiration Date: 202106072359
Blood Product Expiration Date: 202106082359
Blood Product Expiration Date: 202106102359
ISSUE DATE / TIME: 202106040857
ISSUE DATE / TIME: 202106051944
ISSUE DATE / TIME: 202106052211
Unit Type and Rh: 600
Unit Type and Rh: 600
Unit Type and Rh: 600

## 2019-06-13 LAB — CBC
HCT: 26.8 % — ABNORMAL LOW (ref 36.0–46.0)
HCT: 27.8 % — ABNORMAL LOW (ref 36.0–46.0)
Hemoglobin: 8.8 g/dL — ABNORMAL LOW (ref 12.0–15.0)
Hemoglobin: 9.4 g/dL — ABNORMAL LOW (ref 12.0–15.0)
MCH: 30.8 pg (ref 26.0–34.0)
MCH: 30.8 pg (ref 26.0–34.0)
MCHC: 32.8 g/dL (ref 30.0–36.0)
MCHC: 33.8 g/dL (ref 30.0–36.0)
MCV: 93.7 fL (ref 80.0–100.0)
MCV: 91.1 fL (ref 80.0–100.0)
Platelets: 122 10*3/uL — ABNORMAL LOW (ref 150–400)
Platelets: 142 10*3/uL — ABNORMAL LOW (ref 150–400)
RBC: 2.86 MIL/uL — ABNORMAL LOW (ref 3.87–5.11)
RBC: 3.05 MIL/uL — ABNORMAL LOW (ref 3.87–5.11)
RDW: 14.6 % (ref 11.5–15.5)
RDW: 14.5 % (ref 11.5–15.5)
WBC: 7.2 10*3/uL (ref 4.0–10.5)
WBC: 7.5 10*3/uL (ref 4.0–10.5)
nRBC: 0 % (ref 0.0–0.2)
nRBC: 0 % (ref 0.0–0.2)

## 2019-06-13 MED ORDER — BUPROPION HCL 100 MG PO TABS
200.0000 mg | ORAL_TABLET | Freq: Two times a day (BID) | ORAL | Status: DC
  Administered 2019-06-13 – 2019-06-17 (×9): 200 mg
  Filled 2019-06-13 (×9): qty 2

## 2019-06-13 MED ORDER — MONTELUKAST SODIUM 10 MG PO TABS
10.0000 mg | ORAL_TABLET | Freq: Every day | ORAL | Status: DC
  Administered 2019-06-13 – 2019-06-17 (×4): 10 mg
  Filled 2019-06-13 (×5): qty 1

## 2019-06-13 MED ORDER — OXYBUTYNIN CHLORIDE 5 MG PO TABS
10.0000 mg | ORAL_TABLET | Freq: Every evening | ORAL | Status: DC
  Administered 2019-06-13 – 2019-06-15 (×3): 10 mg
  Filled 2019-06-13 (×4): qty 2

## 2019-06-13 MED ORDER — POTASSIUM CHLORIDE 20 MEQ PO PACK
40.0000 meq | PACK | ORAL | Status: AC
  Administered 2019-06-13 (×2): 40 meq
  Filled 2019-06-13 (×2): qty 2

## 2019-06-13 NOTE — Progress Notes (Signed)
Patient ID: Kelly Garrett, female   DOB: [birth year], 29 y.o.   MRN: [id number] Events of yesterday noted.  She is now intubated.  Otherwise stable.  She remains in her brace.  Potentially for surgery tomorrow with Dr. Lovell Sheehan.
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
## 2019-06-13 NOTE — Progress Notes (Signed)
RT note: patient placed on CPAP/PSV of 12/5 at 0750.  Currently tolerating well.  Will continue to monitor.  

## 2019-06-13 NOTE — Progress Notes (Signed)
RT note: sputum sample obtained and sent down to main lab without complications. 

## 2019-06-14 ENCOUNTER — Inpatient Hospital Stay: Payer: Self-pay

## 2019-06-14 ENCOUNTER — Inpatient Hospital Stay (HOSPITAL_COMMUNITY): Payer: Medicaid - Out of State

## 2019-06-14 LAB — GLUCOSE, CAPILLARY: Glucose-Capillary: 111 mg/dL — ABNORMAL HIGH (ref 70–99)

## 2019-06-14 LAB — TYPE AND SCREEN
ABO/RH(D): A NEG
Antibody Screen: NEGATIVE

## 2019-06-14 LAB — URINE CULTURE: Culture: >=100000 — AB

## 2019-06-14 LAB — COMPREHENSIVE METABOLIC PANEL
ALT: 93 U/L — ABNORMAL HIGH (ref 0–44)
AST: 31 U/L (ref 15–41)
Albumin: 2.1 g/dL — ABNORMAL LOW (ref 3.5–5.0)
Alkaline Phosphatase: 58 U/L (ref 38–126)
Anion gap: 11 (ref 5–15)
BUN: 6 mg/dL (ref 6–20)
CO2: 22 mmol/L (ref 22–32)
Calcium: 7.9 mg/dL — ABNORMAL LOW (ref 8.9–10.3)
Chloride: 105 mmol/L (ref 98–111)
Creatinine, Ser: 0.47 mg/dL (ref 0.44–1.00)
GFR calc Af Amer: >60 mL/min (ref >60)
GFR calc non Af Amer: >60 mL/min (ref >60)
Glucose, Bld: 133 mg/dL — ABNORMAL HIGH (ref 70–99)
Potassium: 3.5 mmol/L (ref 3.5–5.1)
Sodium: 138 mmol/L (ref 135–145)
Total Bilirubin: 1.1 mg/dL (ref 0.3–1.2)
Total Protein: 5.5 g/dL — ABNORMAL LOW (ref 6.5–8.1)

## 2019-06-14 LAB — CBC
HCT: 26.2 % — ABNORMAL LOW (ref 36.0–46.0)
Hemoglobin: 8.8 g/dL — ABNORMAL LOW (ref 12.0–15.0)
MCH: 30.8 pg (ref 26.0–34.0)
MCHC: 33.6 g/dL (ref 30.0–36.0)
MCV: 91.6 fL (ref 80.0–100.0)
Platelets: 157 10*3/uL (ref 150–400)
RBC: 2.86 MIL/uL — ABNORMAL LOW (ref 3.87–5.11)
RDW: 14.4 % (ref 11.5–15.5)
WBC: 7.2 10*3/uL (ref 4.0–10.5)
nRBC: 0 % (ref 0.0–0.2)

## 2019-06-14 MED ORDER — MIDAZOLAM HCL 2 MG/2ML IJ SOLN: 2.0000 mg | Freq: Once | INTRAMUSCULAR | Status: DC

## 2019-06-14 MED ORDER — LIDOCAINE HCL 1 % IJ SOLN
INTRAMUSCULAR | Status: AC
  Filled 2019-06-14: qty 20

## 2019-06-14 MED ORDER — CHLORHEXIDINE GLUCONATE CLOTH 2 % EX PADS: 6.0000 | MEDICATED_PAD | Freq: Once | CUTANEOUS | Status: DC

## 2019-06-14 MED ORDER — SODIUM CHLORIDE 0.9% FLUSH
10.0000 mL | INTRAVENOUS | Status: DC | PRN
  Administered 2019-06-23: 10 mL

## 2019-06-14 MED ORDER — FENTANYL CITRATE (PF) 100 MCG/2ML IJ SOLN: 100.0000 ug | Freq: Once | INTRAMUSCULAR | Status: DC

## 2019-06-14 MED ORDER — CEFAZOLIN SODIUM-DEXTROSE 2-4 GM/100ML-% IV SOLN: 2.0000 g | INTRAVENOUS | Status: AC

## 2019-06-14 MED ORDER — ACETAMINOPHEN 160 MG/5ML PO SOLN
1000.0000 mg | Freq: Once | ORAL | Status: AC
  Administered 2019-06-14: 1000 mg
  Filled 2019-06-14: qty 40.6

## 2019-06-14 MED ORDER — SODIUM CHLORIDE 0.9% FLUSH
10.0000 mL | Freq: Two times a day (BID) | INTRAVENOUS | Status: DC
  Administered 2019-06-14 – 2019-06-28 (×9): 10 mL

## 2019-06-14 MED ORDER — ENOXAPARIN SODIUM 30 MG/0.3ML ~~LOC~~ SOLN
30.0000 mg | Freq: Two times a day (BID) | SUBCUTANEOUS | Status: DC
  Administered 2019-06-14 – 2019-06-16 (×5): 30 mg via SUBCUTANEOUS
  Filled 2019-06-14 (×5): qty 0.3
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
## 2019-06-14 NOTE — Progress Notes (Signed)
Peripherally Inserted Central Catheter Placement  The IV Nurse has discussed with the patient and/or persons authorized to consent for the patient, the purpose of this procedure and the potential benefits and risks involved with this procedure.  The benefits include less needle sticks, lab draws from the catheter, and the patient may be discharged home with the catheter. Risks include, but not limited to, infection, bleeding, blood clot (thrombus formation), and puncture of an artery; nerve damage and irregular heartbeat and possibility to perform a PICC exchange if needed/ordered by physician.  Alternatives to this procedure were also discussed.  B[employer] PICC patient education guide, fact sheet on infection prevention and patient information card has been provided to patient /or left at bedside.    PICC Placement Documentation  PICC Double Lumen 06/14/19 PICC Right Brachial 34 cm 0 cm (Active)  Exposed Catheter (cm) 0 cm 06/14/19 1511  Site Assessment Dry;Clean;Intact 06/14/19 1511  Lumen #1 Status Flushed;Saline locked;Blood return noted 06/14/19 1511  Lumen #2 Status Flushed;Saline locked;Blood return noted 06/14/19 1511  Dressing Type Transparent;Securing device 06/14/19 1511  Dressing Status Clean;Intact;Antimicrobial disc in place;Dry 06/14/19 1511  Dressing Change Due 06/21/19 06/14/19 1511       Romie Jumper 06/14/2019, 3:15 PM
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
## 2019-06-14 NOTE — Addendum Note (Signed)
Addendum  created 06/14/19 2014 by Leonides Grills, MD   Intraprocedure Staff edited

## 2019-06-15 ENCOUNTER — Inpatient Hospital Stay (HOSPITAL_COMMUNITY): Payer: Medicaid - Out of State

## 2019-06-15 LAB — MAGNESIUM
Magnesium: 1.7 mg/dL (ref 1.7–2.4)
Magnesium: 1.8 mg/dL (ref 1.7–2.4)
Magnesium: 1.7 mg/dL (ref 1.7–2.4)

## 2019-06-15 LAB — COMPREHENSIVE METABOLIC PANEL
ALT: 72 U/L — ABNORMAL HIGH (ref 0–44)
AST: 24 U/L (ref 15–41)
Albumin: 1.9 g/dL — ABNORMAL LOW (ref 3.5–5.0)
Alkaline Phosphatase: 55 U/L (ref 38–126)
Anion gap: 9 (ref 5–15)
BUN: 5 mg/dL — ABNORMAL LOW (ref 6–20)
CO2: 23 mmol/L (ref 22–32)
Calcium: 7.7 mg/dL — ABNORMAL LOW (ref 8.9–10.3)
Chloride: 106 mmol/L (ref 98–111)
Creatinine, Ser: 0.48 mg/dL (ref 0.44–1.00)
GFR calc Af Amer: >60 mL/min (ref >60)
GFR calc non Af Amer: >60 mL/min (ref >60)
Glucose, Bld: 109 mg/dL — ABNORMAL HIGH (ref 70–99)
Potassium: 3.3 mmol/L — ABNORMAL LOW (ref 3.5–5.1)
Sodium: 138 mmol/L (ref 135–145)
Total Bilirubin: 0.7 mg/dL (ref 0.3–1.2)
Total Protein: 5.4 g/dL — ABNORMAL LOW (ref 6.5–8.1)

## 2019-06-15 LAB — CBC
HCT: 25.6 % — ABNORMAL LOW (ref 36.0–46.0)
Hemoglobin: 8.5 g/dL — ABNORMAL LOW (ref 12.0–15.0)
MCH: 30.4 pg (ref 26.0–34.0)
MCHC: 33.2 g/dL (ref 30.0–36.0)
MCV: 91.4 fL (ref 80.0–100.0)
Platelets: 158 10*3/uL (ref 150–400)
RBC: 2.8 MIL/uL — ABNORMAL LOW (ref 3.87–5.11)
RDW: 14.4 % (ref 11.5–15.5)
WBC: 6.7 10*3/uL (ref 4.0–10.5)
nRBC: 0 % (ref 0.0–0.2)

## 2019-06-15 LAB — GLUCOSE, CAPILLARY
Glucose-Capillary: 122 mg/dL — ABNORMAL HIGH (ref 70–99)
Glucose-Capillary: 120 mg/dL — ABNORMAL HIGH (ref 70–99)
Glucose-Capillary: 117 mg/dL — ABNORMAL HIGH (ref 70–99)

## 2019-06-15 LAB — PHOSPHORUS
Phosphorus: 2.5 mg/dL (ref 2.5–4.6)
Phosphorus: 2.9 mg/dL (ref 2.5–4.6)
Phosphorus: 2.8 mg/dL (ref 2.5–4.6)

## 2019-06-15 MED ORDER — PIVOT 1.5 CAL PO LIQD
1000.0000 mL | ORAL | Status: DC
  Administered 2019-06-15: 1000 mL

## 2019-06-15 MED ORDER — PRO-STAT SUGAR FREE PO LIQD
30.0000 mL | Freq: Two times a day (BID) | ORAL | Status: DC
  Administered 2019-06-15: 30 mL
  Filled 2019-06-15: qty 30

## 2019-06-15 MED ORDER — SODIUM CHLORIDE 0.9 % IV SOLN
2.0000 g | INTRAVENOUS | Status: AC
  Administered 2019-06-15 – 2019-06-19 (×5): 2 g via INTRAVENOUS
  Filled 2019-06-15 (×4): qty 2
  Filled 2019-06-15 (×2): qty 20

## 2019-06-15 MED ORDER — POTASSIUM CHLORIDE 20 MEQ PO PACK
40.0000 meq | PACK | Freq: Two times a day (BID) | ORAL | Status: AC
  Administered 2019-06-15: 40 meq
  Filled 2019-06-15: qty 2

## 2019-06-15 MED ORDER — VITAL HIGH PROTEIN PO LIQD: 1000.0000 mL | ORAL | Status: DC

## 2019-06-15 MED ORDER — POTASSIUM CHLORIDE 20 MEQ PO PACK: 40.0000 meq | PACK | Freq: Two times a day (BID) | ORAL | Status: DC

## 2019-06-15 MED ORDER — FUROSEMIDE 10 MG/ML IJ SOLN
40.0000 mg | Freq: Once | INTRAMUSCULAR | Status: AC
  Administered 2019-06-15: 40 mg via INTRAVENOUS
  Filled 2019-06-15: qty 4

## 2019-06-15 MED ORDER — TRAMADOL HCL 50 MG PO TABS
100.0000 mg | ORAL_TABLET | Freq: Four times a day (QID) | ORAL | Status: DC
  Administered 2019-06-15 – 2019-06-17 (×9): 100 mg
  Filled 2019-06-15 (×9): qty 2
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
## 2019-06-16 ENCOUNTER — Encounter (HOSPITAL_COMMUNITY): Admission: EM | Disposition: A | Discharge status: Home Health | Payer: Self-pay | Source: Home / Self Care

## 2019-06-16 ENCOUNTER — Inpatient Hospital Stay (HOSPITAL_COMMUNITY): Payer: Medicaid - Out of State | Admitting: Anesthesiology

## 2019-06-16 ENCOUNTER — Inpatient Hospital Stay (HOSPITAL_COMMUNITY): Payer: Medicaid - Out of State

## 2019-06-16 ENCOUNTER — Encounter (HOSPITAL_COMMUNITY): Payer: Self-pay

## 2019-06-16 LAB — COMPREHENSIVE METABOLIC PANEL
ALT: 53 U/L — ABNORMAL HIGH (ref 0–44)
AST: 20 U/L (ref 15–41)
Albumin: 2 g/dL — ABNORMAL LOW (ref 3.5–5.0)
Alkaline Phosphatase: 52 U/L (ref 38–126)
Anion gap: 5 (ref 5–15)
BUN: 8 mg/dL (ref 6–20)
CO2: 28 mmol/L (ref 22–32)
Calcium: 7.8 mg/dL — ABNORMAL LOW (ref 8.9–10.3)
Chloride: 106 mmol/L (ref 98–111)
Creatinine, Ser: 0.37 mg/dL — ABNORMAL LOW (ref 0.44–1.00)
GFR calc Af Amer: >60 mL/min (ref >60)
GFR calc non Af Amer: >60 mL/min (ref >60)
Glucose, Bld: 132 mg/dL — ABNORMAL HIGH (ref 70–99)
Potassium: 3.6 mmol/L (ref 3.5–5.1)
Sodium: 139 mmol/L (ref 135–145)
Total Bilirubin: 0.7 mg/dL (ref 0.3–1.2)
Total Protein: 5.5 g/dL — ABNORMAL LOW (ref 6.5–8.1)

## 2019-06-16 LAB — CBC
HCT: 27 % — ABNORMAL LOW (ref 36.0–46.0)
Hemoglobin: 9 g/dL — ABNORMAL LOW (ref 12.0–15.0)
MCH: 30.8 pg (ref 26.0–34.0)
MCHC: 33.3 g/dL (ref 30.0–36.0)
MCV: 92.5 fL (ref 80.0–100.0)
Platelets: 179 10*3/uL (ref 150–400)
RBC: 2.92 MIL/uL — ABNORMAL LOW (ref 3.87–5.11)
RDW: 14.5 % (ref 11.5–15.5)
WBC: 5.9 10*3/uL (ref 4.0–10.5)
nRBC: 0 % (ref 0.0–0.2)

## 2019-06-16 LAB — PHOSPHORUS: Phosphorus: 2.2 mg/dL — ABNORMAL LOW (ref 2.5–4.6)

## 2019-06-16 LAB — GLUCOSE, CAPILLARY
Glucose-Capillary: 120 mg/dL — ABNORMAL HIGH (ref 70–99)
Glucose-Capillary: 137 mg/dL — ABNORMAL HIGH (ref 70–99)
Glucose-Capillary: 119 mg/dL — ABNORMAL HIGH (ref 70–99)
Glucose-Capillary: 136 mg/dL — ABNORMAL HIGH (ref 70–99)
Glucose-Capillary: 122 mg/dL — ABNORMAL HIGH (ref 70–99)

## 2019-06-16 LAB — MAGNESIUM: Magnesium: 1.6 mg/dL — ABNORMAL LOW (ref 1.7–2.4)

## 2019-06-16 LAB — HEPATITIS PANEL, ACUTE
HCV Ab: REACTIVE — AB
Hep A IgM: NONREACTIVE
Hep B C IgM: NONREACTIVE
Hepatitis B Surface Ag: NONREACTIVE

## 2019-06-16 SURGERY — POSTERIOR LUMBAR FUSION 1 LEVEL
Anesthesia: General

## 2019-06-16 MED ORDER — 0.9 % SODIUM CHLORIDE (POUR BTL) OPTIME
TOPICAL | Status: DC | PRN
  Administered 2019-06-16 (×2): 1000 mL

## 2019-06-16 MED ORDER — LACTATED RINGERS IV SOLN
INTRAVENOUS | Status: DC | PRN
Start: 2019-06-16 — End: 2019-06-16

## 2019-06-16 MED ORDER — BACITRACIN ZINC 500 UNIT/GM EX OINT
TOPICAL_OINTMENT | CUTANEOUS | Status: AC
  Filled 2019-06-16: qty 28.35

## 2019-06-16 MED ORDER — PHENYLEPHRINE HCL (PRESSORS) 10 MG/ML IV SOLN
INTRAVENOUS | Status: DC | PRN
Start: 2019-06-16 — End: 2019-06-16
  Administered 2019-06-16: 40 ug via INTRAVENOUS

## 2019-06-16 MED ORDER — BUPIVACAINE HCL (PF) 0.5 % IJ SOLN
INTRAMUSCULAR | Status: AC
  Filled 2019-06-16: qty 30

## 2019-06-16 MED ORDER — PROPOFOL 10 MG/ML IV BOLUS
INTRAVENOUS | Status: AC
  Filled 2019-06-16: qty 20

## 2019-06-16 MED ORDER — FENTANYL CITRATE (PF) 250 MCG/5ML IJ SOLN
INTRAMUSCULAR | Status: DC | PRN
  Administered 2019-06-16 (×2): 50 ug via INTRAVENOUS
  Administered 2019-06-16: 100 ug via INTRAVENOUS
  Administered 2019-06-16: 50 ug via INTRAVENOUS

## 2019-06-16 MED ORDER — BUPIVACAINE-EPINEPHRINE 0.5% -1:200000 IJ SOLN
INTRAMUSCULAR | Status: AC
  Filled 2019-06-16: qty 1

## 2019-06-16 MED ORDER — FENTANYL CITRATE (PF) 250 MCG/5ML IJ SOLN
INTRAMUSCULAR | Status: AC
  Filled 2019-06-16: qty 5

## 2019-06-16 MED ORDER — MIDAZOLAM HCL 2 MG/2ML IJ SOLN
INTRAMUSCULAR | Status: AC
  Filled 2019-06-16: qty 2

## 2019-06-16 MED ORDER — BUPIVACAINE LIPOSOME 1.3 % IJ SUSP
20.0000 mL | INTRAMUSCULAR | Status: AC
  Filled 2019-06-16: qty 20

## 2019-06-16 MED ORDER — VANCOMYCIN HCL IN DEXTROSE 1-5 GM/200ML-% IV SOLN
INTRAVENOUS | Status: AC
  Filled 2019-06-16: qty 200

## 2019-06-16 MED ORDER — BUPIVACAINE LIPOSOME 1.3 % IJ SUSP
INTRAMUSCULAR | Status: DC | PRN
  Administered 2019-06-16: 20 mL

## 2019-06-16 MED ORDER — MAGNESIUM SULFATE 4 GM/100ML IV SOLN
4.0000 g | Freq: Once | INTRAVENOUS | Status: AC
  Administered 2019-06-16: 4 g via INTRAVENOUS
  Filled 2019-06-16: qty 100

## 2019-06-16 MED ORDER — EPHEDRINE 5 MG/ML INJ
INTRAVENOUS | Status: AC
  Filled 2019-06-16: qty 10

## 2019-06-16 MED ORDER — PHENYLEPHRINE 40 MCG/ML (10ML) SYRINGE FOR IV PUSH (FOR BLOOD PRESSURE SUPPORT)
PREFILLED_SYRINGE | INTRAVENOUS | Status: AC
  Filled 2019-06-16: qty 10

## 2019-06-16 MED ORDER — DEXMEDETOMIDINE HCL IN NACL 200 MCG/50ML IV SOLN
INTRAVENOUS | Status: AC
  Filled 2019-06-16: qty 50

## 2019-06-16 MED ORDER — VANCOMYCIN HCL 1000 MG IV SOLR
INTRAVENOUS | Status: DC | PRN
  Administered 2019-06-16: 1000 mg via INTRAVENOUS

## 2019-06-16 MED ORDER — SODIUM PHOSPHATES 45 MMOLE/15ML IV SOLN
30.0000 mmol | Freq: Once | INTRAVENOUS | Status: AC
  Administered 2019-06-16: 30 mmol via INTRAVENOUS
  Filled 2019-06-16: qty 10

## 2019-06-16 MED ORDER — PROPOFOL 10 MG/ML IV BOLUS
INTRAVENOUS | Status: DC | PRN
  Administered 2019-06-16: 120 mg via INTRAVENOUS

## 2019-06-16 MED ORDER — BUPIVACAINE-EPINEPHRINE (PF) 0.5% -1:200000 IJ SOLN
INTRAMUSCULAR | Status: DC | PRN
  Administered 2019-06-16: 10 mL via PERINEURAL

## 2019-06-16 MED ORDER — ROCURONIUM BROMIDE 100 MG/10ML IV SOLN
INTRAVENOUS | Status: DC | PRN
Start: 2019-06-16 — End: 2019-06-16
  Administered 2019-06-16 (×3): 50 mg via INTRAVENOUS

## 2019-06-16 MED ORDER — ROCURONIUM BROMIDE 10 MG/ML (PF) SYRINGE
PREFILLED_SYRINGE | INTRAVENOUS | Status: AC
  Filled 2019-06-16: qty 10

## 2019-06-16 MED ORDER — BACITRACIN ZINC 500 UNIT/GM EX OINT
TOPICAL_OINTMENT | CUTANEOUS | Status: DC | PRN
  Administered 2019-06-16: 1 via TOPICAL

## 2019-06-16 MED ORDER — THROMBIN 5000 UNITS EX SOLR
CUTANEOUS | Status: AC
  Filled 2019-06-16: qty 5000

## 2019-06-16 MED ORDER — MIDAZOLAM HCL 5 MG/5ML IJ SOLN
INTRAMUSCULAR | Status: DC | PRN
  Administered 2019-06-16: 2 mg via INTRAVENOUS

## 2019-06-16 MED ORDER — THROMBIN 5000 UNITS EX SOLR: OROMUCOSAL | Status: DC | PRN

## 2019-06-16 MED ORDER — SODIUM CHLORIDE 0.9 % IV SOLN: INTRAVENOUS | Status: DC | PRN

## 2019-06-16 SURGICAL SUPPLY — 62 items
BAG DECANTER FOR FLEXI CONT (MISCELLANEOUS) ×2 IMPLANT
BENZOIN TINCTURE PRP APPL 2/3 (GAUZE/BANDAGES/DRESSINGS) ×2 IMPLANT
BONE MATRIX TRINITY 10CC (Bone Implant) ×2 IMPLANT
BUR MATCHSTICK NEURO 3.0 LAGG (BURR) ×2 IMPLANT
BUR PRECISION FLUTE 6.0 (BURR) ×2 IMPLANT
CANISTER SUCT 3000ML PPV (MISCELLANEOUS) ×2 IMPLANT
CAP LOCK DLX THRD (Cap) ×16 IMPLANT
CARTRIDGE OIL MAESTRO DRILL (MISCELLANEOUS) ×1 IMPLANT
CLSR STERI-STRIP ANTIMIC 1/2X4 (GAUZE/BANDAGES/DRESSINGS) ×2 IMPLANT
CNTNR URN SCR LID CUP LEK RST (MISCELLANEOUS) ×1 IMPLANT
CONT SPEC 4OZ STRL OR WHT (MISCELLANEOUS) ×1
COVER BACK TABLE 60X90IN (DRAPES) ×2 IMPLANT
DECANTER SPIKE VIAL GLASS SM (MISCELLANEOUS) ×2 IMPLANT
DIFFUSER DRILL AIR PNEUMATIC (MISCELLANEOUS) ×2 IMPLANT
DRAPE C-ARM 42X72 X-RAY (DRAPES) ×4 IMPLANT
DRAPE HALF SHEET 40X57 (DRAPES) ×2 IMPLANT
DRAPE LAPAROTOMY 100X72X124 (DRAPES) ×2 IMPLANT
DRAPE SURG 17X23 STRL (DRAPES) ×8 IMPLANT
DRSG OPSITE POSTOP 4X8 (GAUZE/BANDAGES/DRESSINGS) ×2 IMPLANT
ELECT BLADE 4.0 EZ CLEAN MEGAD (MISCELLANEOUS) ×2
ELECT REM PT RETURN 9FT ADLT (ELECTROSURGICAL) ×2
ELECTRODE BLDE 4.0 EZ CLN MEGD (MISCELLANEOUS) ×1 IMPLANT
ELECTRODE REM PT RTRN 9FT ADLT (ELECTROSURGICAL) ×1 IMPLANT
EVACUATOR 1/8 PVC DRAIN (DRAIN) IMPLANT
GAUZE 4X4 16PLY RFD (DISPOSABLE) ×2 IMPLANT
GAUZE SPONGE 4X4 12PLY STRL (GAUZE/BANDAGES/DRESSINGS) ×2 IMPLANT
GLOVE BIO SURGEON STRL SZ8 (GLOVE) ×4 IMPLANT
GLOVE BIO SURGEON STRL SZ8.5 (GLOVE) ×4 IMPLANT
GLOVE EXAM NITRILE XL STR (GLOVE) IMPLANT
GOWN STRL REUS W/ TWL LRG LVL3 (GOWN DISPOSABLE) IMPLANT
GOWN STRL REUS W/ TWL XL LVL3 (GOWN DISPOSABLE) ×2 IMPLANT
GOWN STRL REUS W/TWL 2XL LVL3 (GOWN DISPOSABLE) IMPLANT
GOWN STRL REUS W/TWL LRG LVL3 (GOWN DISPOSABLE)
GOWN STRL REUS W/TWL XL LVL3 (GOWN DISPOSABLE) ×2
HEMOSTAT POWDER KIT SURGIFOAM (HEMOSTASIS) ×2 IMPLANT
KIT BASIN OR (CUSTOM PROCEDURE TRAY) ×2 IMPLANT
KIT TURNOVER KIT B (KITS) ×2 IMPLANT
MILL MEDIUM DISP (BLADE) ×2 IMPLANT
NEEDLE HYPO 21X1.5 SAFETY (NEEDLE) ×2 IMPLANT
NEEDLE HYPO 22GX1.5 SAFETY (NEEDLE) ×2 IMPLANT
NS IRRIG 1000ML POUR BTL (IV SOLUTION) ×2 IMPLANT
OIL CARTRIDGE MAESTRO DRILL (MISCELLANEOUS) ×2
PACK LAMINECTOMY NEURO (CUSTOM PROCEDURE TRAY) ×2 IMPLANT
PAD ARMBOARD 7.5X6 YLW CONV (MISCELLANEOUS) ×6 IMPLANT
PATTIES SURGICAL .5 X1 (DISPOSABLE) IMPLANT
PUTTY DBM 10CC CALC GRAN (Putty) ×2 IMPLANT
ROD STRT TI 6.35X150 (Rod) ×4 IMPLANT
SCREW PA CREO DLX 6.5X45 (Screw) ×8 IMPLANT
SCREW PA CREO DLX 6.5X50 (Screw) ×8 IMPLANT
SPONGE LAP 4X18 RFD (DISPOSABLE) IMPLANT
SPONGE NEURO XRAY DETECT 1X3 (DISPOSABLE) IMPLANT
SPONGE SURGIFOAM ABS GEL 100 (HEMOSTASIS) IMPLANT
STRIP CLOSURE SKIN 1/2X4 (GAUZE/BANDAGES/DRESSINGS) ×2 IMPLANT
SUT VIC AB 1 CT1 18XBRD ANBCTR (SUTURE) ×2 IMPLANT
SUT VIC AB 1 CT1 8-18 (SUTURE) ×2
SUT VIC AB 2-0 CP2 18 (SUTURE) ×4 IMPLANT
SYR 20ML LL LF (SYRINGE) IMPLANT
SYR 30ML LL (SYRINGE) ×2 IMPLANT
TOWEL GREEN STERILE (TOWEL DISPOSABLE) ×2 IMPLANT
TOWEL GREEN STERILE FF (TOWEL DISPOSABLE) ×2 IMPLANT
TRAY FOLEY MTR SLVR 16FR STAT (SET/KITS/TRAYS/PACK) ×2 IMPLANT
WATER STERILE IRR 1000ML POUR (IV SOLUTION) ×2 IMPLANT
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
## 2019-06-16 NOTE — Transfer of Care (Signed)
Immediate Anesthesia Transfer of Care Note  Patient: Kelly Garrett  Procedure(s) Performed: THORACIC TWELVE - LUMBAR THREE DECOMPRESSION, INSTRUMENTATION AND FUSION (N/A )  Patient Location: ICU  Anesthesia Type:General  Level of Consciousness: sedated and Patient remains intubated per anesthesia plan  Airway & Oxygen Therapy: Patient remains intubated per anesthesia plan and Patient placed on Ventilator (see vital sign flow sheet for setting)  Post-op Assessment: Report given to RN and Post -op Vital signs reviewed and stable  Post vital signs: Reviewed and stable  Last Vitals:  Vitals Value Taken Time  BP 139/84 06/16/19 2131  Temp    Pulse 84 06/16/19 2140  Resp 19 06/16/19 2140  SpO2 97 % 06/16/19 2140  Vitals shown include unvalidated device data.  Last Pain:  Vitals:   06/16/19 1600  TempSrc: Axillary  PainSc:          Complications: No apparent anesthesia complications
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
## 2019-06-16 NOTE — Anesthesia Preprocedure Evaluation (Signed)
Anesthesia Evaluation  Patient identified by MRN, date of birth, ID band Patient unresponsive    Reviewed: Unable to perform ROS - Chart review onlyPreop documentation limited or incomplete due to emergent nature of procedure.  Airway Mallampati: Intubated       Dental   Pulmonary Current Smoker,     + decreased breath sounds      Cardiovascular  Rhythm:Regular Rate:Tachycardia     Neuro/Psych    GI/Hepatic GERD  Medicated,(+) Hepatitis -, C  Endo/Other  negative endocrine ROS  Renal/GU negative Renal ROS     Musculoskeletal   Abdominal Normal abdominal exam  (+)   Peds  Hematology   Anesthesia Other Findings   Reproductive/Obstetrics                             Anesthesia Physical Anesthesia Plan  ASA: III  Anesthesia Plan: General   Post-op Pain Management:    Induction: Inhalational  PONV Risk Score and Plan: 3 and Ondansetron, Midazolam and Dexamethasone  Airway Management Planned: Oral ETT  Additional Equipment: None  Intra-op Plan:   Post-operative Plan: Post-operative intubation/ventilation  Informed Consent:     History available from chart only and Only emergency history available  Plan Discussed with: CRNA and Surgeon  Anesthesia Plan Comments:         Anesthesia Quick Evaluation
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
## 2019-06-17 ENCOUNTER — Inpatient Hospital Stay (HOSPITAL_COMMUNITY): Payer: Medicaid - Out of State

## 2019-06-17 HISTORY — PX: LUMBAR SPINE SURGERY: SHX701

## 2019-06-17 LAB — CULTURE, RESPIRATORY W GRAM STAIN

## 2019-06-17 LAB — CBC
HCT: 26.5 % — ABNORMAL LOW (ref 36.0–46.0)
Hemoglobin: 8.7 g/dL — ABNORMAL LOW (ref 12.0–15.0)
MCH: 30.3 pg (ref 26.0–34.0)
MCHC: 32.8 g/dL (ref 30.0–36.0)
MCV: 92.3 fL (ref 80.0–100.0)
Platelets: 196 10*3/uL (ref 150–400)
RBC: 2.87 MIL/uL — ABNORMAL LOW (ref 3.87–5.11)
RDW: 14.3 % (ref 11.5–15.5)
WBC: 9.7 10*3/uL (ref 4.0–10.5)
nRBC: 0 % (ref 0.0–0.2)

## 2019-06-17 LAB — CULTURE, BLOOD (ROUTINE X 2)
Culture: NO GROWTH
Culture: NO GROWTH

## 2019-06-17 LAB — GLUCOSE, CAPILLARY
Glucose-Capillary: 108 mg/dL — ABNORMAL HIGH (ref 70–99)
Glucose-Capillary: 143 mg/dL — ABNORMAL HIGH (ref 70–99)
Glucose-Capillary: 121 mg/dL — ABNORMAL HIGH (ref 70–99)
Glucose-Capillary: 124 mg/dL — ABNORMAL HIGH (ref 70–99)
Glucose-Capillary: 99 mg/dL (ref 70–99)

## 2019-06-17 LAB — MAGNESIUM: Magnesium: 2.1 mg/dL (ref 1.7–2.4)

## 2019-06-17 LAB — PHOSPHORUS: Phosphorus: 2.9 mg/dL (ref 2.5–4.6)

## 2019-06-17 MED ORDER — DOCUSATE SODIUM 50 MG/5ML PO LIQD
100.0000 mg | Freq: Two times a day (BID) | ORAL | Status: DC
  Administered 2019-06-17 – 2019-06-22 (×11): 100 mg via ORAL
  Filled 2019-06-17 (×11): qty 10

## 2019-06-17 MED ORDER — MONTELUKAST SODIUM 10 MG PO TABS
10.0000 mg | ORAL_TABLET | Freq: Every day | ORAL | Status: DC
  Administered 2019-06-18 – 2019-06-28 (×11): 10 mg via ORAL
  Filled 2019-06-17 (×11): qty 1

## 2019-06-17 MED ORDER — GABAPENTIN 300 MG PO CAPS
600.0000 mg | ORAL_CAPSULE | Freq: Three times a day (TID) | ORAL | Status: DC
  Administered 2019-06-17 (×2): 600 mg via ORAL
  Filled 2019-06-17 (×2): qty 2

## 2019-06-17 MED ORDER — RISPERIDONE 0.5 MG PO TABS
0.5000 mg | ORAL_TABLET | Freq: Two times a day (BID) | ORAL | Status: DC
  Administered 2019-06-17 – 2019-06-21 (×4): 0.5 mg via ORAL
  Filled 2019-06-17 (×23): qty 1

## 2019-06-17 MED ORDER — CLONIDINE HCL 0.1 MG PO TABS
0.1000 mg | ORAL_TABLET | Freq: Two times a day (BID) | ORAL | Status: DC
  Administered 2019-06-17 – 2019-06-28 (×21): 0.1 mg via ORAL
  Filled 2019-06-17 (×22): qty 1

## 2019-06-17 MED ORDER — GABAPENTIN 250 MG/5ML PO SOLN
600.0000 mg | Freq: Three times a day (TID) | ORAL | Status: DC
  Filled 2019-06-17 (×2): qty 12

## 2019-06-17 MED ORDER — CHLORHEXIDINE GLUCONATE CLOTH 2 % EX PADS
6.0000 | MEDICATED_PAD | Freq: Every day | CUTANEOUS | Status: DC
  Administered 2019-06-18 – 2019-06-24 (×6): 6 via TOPICAL

## 2019-06-17 MED ORDER — BACLOFEN 10 MG PO TABS
10.0000 mg | ORAL_TABLET | Freq: Four times a day (QID) | ORAL | Status: DC
  Administered 2019-06-17 – 2019-06-28 (×44): 10 mg via ORAL
  Filled 2019-06-17 (×45): qty 1

## 2019-06-17 MED ORDER — OXYBUTYNIN CHLORIDE 5 MG PO TABS
10.0000 mg | ORAL_TABLET | Freq: Every evening | ORAL | Status: DC
  Administered 2019-06-17 – 2019-06-27 (×6): 10 mg via ORAL
  Filled 2019-06-17 (×10): qty 2

## 2019-06-17 MED ORDER — TRAMADOL HCL 50 MG PO TABS
100.0000 mg | ORAL_TABLET | Freq: Four times a day (QID) | ORAL | Status: DC
  Administered 2019-06-17 – 2019-06-28 (×36): 100 mg via ORAL
  Filled 2019-06-17 (×39): qty 2

## 2019-06-17 MED ORDER — ACETAMINOPHEN 325 MG PO TABS
650.0000 mg | ORAL_TABLET | Freq: Four times a day (QID) | ORAL | Status: DC | PRN
  Administered 2019-06-17: 650 mg via ORAL
  Filled 2019-06-17: qty 2

## 2019-06-17 MED ORDER — BUPROPION HCL 100 MG PO TABS
200.0000 mg | ORAL_TABLET | Freq: Two times a day (BID) | ORAL | Status: DC
  Administered 2019-06-21: 200 mg via ORAL
  Filled 2019-06-17 (×22): qty 2

## 2019-06-17 MED ORDER — ALPRAZOLAM 0.5 MG PO TABS
2.0000 mg | ORAL_TABLET | Freq: Four times a day (QID) | ORAL | Status: DC | PRN
  Administered 2019-06-17 – 2019-06-28 (×32): 2 mg via ORAL
  Filled 2019-06-17 (×32): qty 4

## 2019-06-17 MED ORDER — PANTOPRAZOLE SODIUM 40 MG PO PACK
40.0000 mg | PACK | Freq: Every day | ORAL | Status: DC
  Administered 2019-06-19 – 2019-06-27 (×7): 40 mg via ORAL
  Filled 2019-06-17 (×10): qty 20
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
## 2019-06-17 NOTE — Procedures (Signed)
Extubation Procedure Note  Patient Details:   Name: Kelly Garrett DOB: 05-12-1990 MRN: [id number]   Airway Documentation:    Vent end date: 06/17/19 Vent end time: 1102   Evaluation  O2 sats: stable throughout Complications: No apparent complications Patient did tolerate procedure well. Bilateral Breath Sounds: Diminished   Yes   Patient extubated per order to 4L [place] with no apparent complications. Positive cuff leak was noted prior to extubation. Patient is alert and oriented and is able to speak. Vitals are stable. RT will continue to monitor.   Darris Carachure Lajuana Ripple 06/17/2019, 11:05 AM
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
## 2019-06-17 NOTE — Progress Notes (Signed)
Patient ID: Kelly Garrett, female   DOB: March 25, 1990, 29 y.o.   MRN: [id number] Doing well post extubation.  Violeta Gelinas, MD, MPH, FACS Please use [email or web address] to contact on call provider

## 2019-06-17 NOTE — Anesthesia Postprocedure Evaluation (Signed)
Anesthesia Post Note  Patient: Sherrol Vicars  Procedure(s) Performed: THORACIC TWELVE - LUMBAR THREE DECOMPRESSION, INSTRUMENTATION AND FUSION (N/A )     Patient location during evaluation: SICU Anesthesia Type: General Level of consciousness: sedated Pain management: pain level controlled Vital Signs Assessment: post-procedure vital signs reviewed and stable Respiratory status: patient remains intubated per anesthesia plan Cardiovascular status: stable Postop Assessment: no apparent nausea or vomiting Anesthetic complications: no    Last Vitals:  Vitals:   06/16/19 1700 06/16/19 2330  BP: (!) 153/87 (!) 142/77  Pulse: 81 75  Resp: 18 18  Temp:    SpO2: 94% 100%    Last Pain:  Vitals:   06/16/19 1600  TempSrc: Axillary  PainSc:                  Catheryn Bacon Mairim Bade

## 2019-06-18 ENCOUNTER — Inpatient Hospital Stay (HOSPITAL_COMMUNITY): Payer: Medicaid - Out of State

## 2019-06-18 LAB — CBC
HCT: 27.6 % — ABNORMAL LOW (ref 36.0–46.0)
Hemoglobin: 9.2 g/dL — ABNORMAL LOW (ref 12.0–15.0)
MCH: 30.6 pg (ref 26.0–34.0)
MCHC: 33.3 g/dL (ref 30.0–36.0)
MCV: 91.7 fL (ref 80.0–100.0)
Platelets: 214 10*3/uL (ref 150–400)
RBC: 3.01 MIL/uL — ABNORMAL LOW (ref 3.87–5.11)
RDW: 14.2 % (ref 11.5–15.5)
WBC: 11.4 10*3/uL — ABNORMAL HIGH (ref 4.0–10.5)
nRBC: 0 % (ref 0.0–0.2)

## 2019-06-18 LAB — BASIC METABOLIC PANEL
Anion gap: 10 (ref 5–15)
BUN: <5 mg/dL — ABNORMAL LOW (ref 6–20)
CO2: 25 mmol/L (ref 22–32)
Calcium: 7.8 mg/dL — ABNORMAL LOW (ref 8.9–10.3)
Chloride: 104 mmol/L (ref 98–111)
Creatinine, Ser: 0.37 mg/dL — ABNORMAL LOW (ref 0.44–1.00)
GFR calc Af Amer: >60 mL/min (ref >60)
GFR calc non Af Amer: >60 mL/min (ref >60)
Glucose, Bld: 111 mg/dL — ABNORMAL HIGH (ref 70–99)
Potassium: 3 mmol/L — ABNORMAL LOW (ref 3.5–5.1)
Sodium: 139 mmol/L (ref 135–145)

## 2019-06-18 LAB — GLUCOSE, CAPILLARY: Glucose-Capillary: 116 mg/dL — ABNORMAL HIGH (ref 70–99)

## 2019-06-18 MED ORDER — SODIUM CHLORIDE 0.9 % IV SOLN
INTRAVENOUS | Status: DC | PRN
  Administered 2019-06-18: 500 mL via INTRAVENOUS

## 2019-06-18 MED ORDER — POTASSIUM CHLORIDE 10 MEQ/50ML IV SOLN
10.0000 meq | INTRAVENOUS | Status: AC
  Administered 2019-06-18 (×4): 10 meq via INTRAVENOUS
  Filled 2019-06-18 (×5): qty 50

## 2019-06-18 MED ORDER — POTASSIUM CHLORIDE CRYS ER 20 MEQ PO TBCR
20.0000 meq | EXTENDED_RELEASE_TABLET | ORAL | Status: AC
  Administered 2019-06-18: 20 meq via ORAL
  Filled 2019-06-18 (×2): qty 1

## 2019-06-18 MED ORDER — ACETAMINOPHEN 500 MG PO TABS
1000.0000 mg | ORAL_TABLET | Freq: Four times a day (QID) | ORAL | Status: DC
  Administered 2019-06-18 – 2019-06-28 (×33): 1000 mg via ORAL
  Filled 2019-06-18 (×39): qty 2

## 2019-06-18 MED ORDER — KETOROLAC TROMETHAMINE 15 MG/ML IJ SOLN
30.0000 mg | Freq: Four times a day (QID) | INTRAMUSCULAR | Status: AC
  Administered 2019-06-18 – 2019-06-23 (×20): 30 mg via INTRAVENOUS
  Filled 2019-06-18 (×20): qty 2

## 2019-06-18 MED ORDER — POTASSIUM CHLORIDE 20 MEQ/15ML (10%) PO SOLN
40.0000 meq | ORAL | Status: AC
  Administered 2019-06-18: 40 meq via ORAL
  Filled 2019-06-18: qty 30

## 2019-06-18 MED ORDER — OXYCODONE HCL 5 MG/5ML PO SOLN
10.0000 mg | ORAL | Status: DC | PRN
  Administered 2019-06-18: 15 mg
  Filled 2019-06-18: qty 15

## 2019-06-18 MED ORDER — OXYCODONE HCL 5 MG/5ML PO SOLN
10.0000 mg | ORAL | Status: DC | PRN
  Administered 2019-06-18 – 2019-06-21 (×11): 15 mg via ORAL
  Administered 2019-06-21: 10 mg via ORAL
  Administered 2019-06-22 – 2019-06-24 (×10): 15 mg via ORAL
  Filled 2019-06-18 (×24): qty 15

## 2019-06-18 MED ORDER — ENSURE ENLIVE PO LIQD
237.0000 mL | Freq: Two times a day (BID) | ORAL | Status: DC
  Administered 2019-06-19 – 2019-06-21 (×2): 237 mL via ORAL

## 2019-06-18 MED ORDER — GABAPENTIN 400 MG PO CAPS
800.0000 mg | ORAL_CAPSULE | Freq: Four times a day (QID) | ORAL | Status: DC
  Administered 2019-06-18 – 2019-06-28 (×36): 800 mg via ORAL
  Filled 2019-06-18 (×37): qty 2

## 2019-06-18 MED ORDER — HYDROMORPHONE HCL 1 MG/ML IJ SOLN
2.0000 mg | INTRAMUSCULAR | Status: DC | PRN
  Administered 2019-06-18 – 2019-06-21 (×13): 2 mg via INTRAVENOUS
  Filled 2019-06-18 (×14): qty 2
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
## 2019-06-18 NOTE — Progress Notes (Signed)
Orthopedic Tech Progress Note Patient Details:  Kelly Garrett 10-23-1990 [id number] RN said patient does not need C COLLAR at this time Patient ID: Kelly Garrett, female   DOB: 1990-06-13, 29 y.o.   MRN: [id number]   Donald Pore 06/18/2019, 8:02 AM

## 2019-06-18 NOTE — Progress Notes (Signed)
Subjective: The patient is alert and pleasant.  She complains of back pain.  Objective: Vital signs in last 24 hours: Temp:  [98.3 F (36.8 C)-101.7 F (38.7 C)] 98.3 F (36.8 C) (06/11 0400) Pulse Rate:  [25-113] 93 (06/11 0600) Resp:  [12-25] 23 (06/11 0600) BP: (115-161)/(63-97) 127/76 (06/11 0600) SpO2:  [93 %-100 %] 96 % (06/11 0600) FiO2 (%):  [36 %-40 %] 36 % (06/10 1106) Weight:  [64.3 kg] 64.3 kg (06/11 0456) Estimated body mass index is 24.33 kg/m as calculated from the following:   Height as of this encounter: 5\' 4"  (1.626 m).   Weight as of this encounter: 64.3 kg.   Intake/Output from previous day: 06/10 0701 - 06/11 0700 In: 3012.4 [P.O.:360; I.V.:1639.6; NG/GT:520; IV Piggyback:492.8] Out: 4300 [Urine:4300] Intake/Output this shift: Total I/O In: 1419.5 [P.O.:360; I.V.:766.7; IV Piggyback:292.8] Out: 1800 [Urine:1800]  Physical exam patient is alert and oriented.  She is moving her lower extremities well.  Lab Results: Recent Labs    06/17/19 1200 06/18/19 0450  WBC 9.7 11.4*  HGB 8.7* 9.2*  HCT 26.5* 27.6*  PLT 196 214   BMET Recent Labs    06/16/19 0541 06/18/19 0450  NA 139 139  K 3.6 3.0*  CL 106 104  CO2 28 25  GLUCOSE 132* 111*  BUN 8 <5*  CREATININE 0.37* 0.37*  CALCIUM 7.8* 7.8*    Studies/Results: DG Thoracolumabar Spine  Result Date: 06/16/2019 CLINICAL DATA:  Lumbar decompression. EXAM: THORACOLUMBAR SPINE 1V COMPARISON:  CT dated June 10, 2019 FINDINGS: The patient has undergone posterior fusion from T11 through L2. Again noted is a fracture of the L1 vertebral body. The hardware appears intact where visualized. There are expected postsurgical changes. IMPRESSION: Status post posterior fusion as above. Electronically Signed   By: June 12, 2019 M.D.   On: 06/16/2019 20:50   DG Wrist 2 Views Left  Result Date: 06/17/2019 CLINICAL DATA:  Distal radial fracture EXAM: LEFT WRIST - 2 VIEW COMPARISON:  06/10/2019 FINDINGS: Frontal  and lateral views of the left wrist demonstrate stable position of the comminuted intra-articular distal left radial fracture with dorsal impaction and translation of the distal fracture fragment. Small ossific density adjacent to the pisiform, may reflect small ulnar styloid fracture. Radiocarpal joint remains well aligned. Casting material obscures underlying bony detail. IMPRESSION: 1. Stable position of the comminuted intra-articular distal left radial fracture. 2. Small ossific density interposed between the pisiform and ulnar styloid, which may reflect a small ulnar styloid fracture. Electronically Signed   By: 08/10/2019 M.D.   On: 06/17/2019 15:11   DG C-Arm 1-60 Min  Result Date: 06/16/2019 CLINICAL DATA:  Posterior fusion EXAM: DG C-ARM 1-60 MIN FLUOROSCOPY TIME:  Fluoroscopy Time:  47 seconds Radiation Exposure Index (if provided by the fluoroscopic device): 16.2 mGy Number of Acquired Spot Images: 5 COMPARISON:  CT dated June 10, 2019 FINDINGS: The patient has undergone posterior fusion from T11 through L2. Again noted is a fracture of the L1 vertebral body. The hardware appears intact where visualized. There are expected postsurgical changes. IMPRESSION: Status post posterior fusion as above. Electronically Signed   By: June 12, 2019 M.D.   On: 06/16/2019 20:51    Assessment/Plan: Postop day #2: The patient can be mobilized with her TLSO from my point of view.  LOS: 8 days     08/16/2019 06/18/2019, 6:51 AM

## 2019-06-18 NOTE — Progress Notes (Signed)
Pt received from 4N in bed.  O2 on at 4L [place].  Left arm is wrapped, up on pillow.  Pt requesting pain meds.

## 2019-06-18 NOTE — Progress Notes (Signed)
Rehab Admissions Coordinator Note:  Patient was screened by Clois Dupes for appropriateness for an Inpatient Acute Rehab Consult per PT recs. .  At this time, we are recommending Inpatient Rehab consult. I will place order per protocol.  Clois Dupes RN MSN 06/18/2019, 11:48 AM  I can be reached at [phone].

## 2019-06-18 NOTE — Progress Notes (Signed)
Pt placed on cardiac monitoring and verified with second verifier. BP high, will recheck, pt states she feels anxious.
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
## 2019-06-19 ENCOUNTER — Encounter (HOSPITAL_COMMUNITY): Admission: EM | Disposition: A | Discharge status: Home Health | Payer: Self-pay | Source: Home / Self Care

## 2019-06-19 ENCOUNTER — Inpatient Hospital Stay (HOSPITAL_COMMUNITY): Payer: Medicaid - Out of State | Admitting: Certified Registered"

## 2019-06-19 ENCOUNTER — Inpatient Hospital Stay (HOSPITAL_COMMUNITY): Payer: Medicaid - Out of State

## 2019-06-19 HISTORY — PX: OPEN REDUCTION INTERNAL FIXATION (ORIF) DISTAL RADIAL FRACTURE: SHX5989

## 2019-06-19 LAB — CBC
HCT: 28.7 % — ABNORMAL LOW (ref 36.0–46.0)
Hemoglobin: 9.4 g/dL — ABNORMAL LOW (ref 12.0–15.0)
MCH: 30.8 pg (ref 26.0–34.0)
MCHC: 32.8 g/dL (ref 30.0–36.0)
MCV: 94.1 fL (ref 80.0–100.0)
Platelets: 282 10*3/uL (ref 150–400)
RBC: 3.05 MIL/uL — ABNORMAL LOW (ref 3.87–5.11)
RDW: 14.9 % (ref 11.5–15.5)
WBC: 9.9 10*3/uL (ref 4.0–10.5)
nRBC: 0 % (ref 0.0–0.2)

## 2019-06-19 LAB — BASIC METABOLIC PANEL
Anion gap: 10 (ref 5–15)
BUN: 7 mg/dL (ref 6–20)
CO2: 26 mmol/L (ref 22–32)
Calcium: 8.1 mg/dL — ABNORMAL LOW (ref 8.9–10.3)
Chloride: 104 mmol/L (ref 98–111)
Creatinine, Ser: 0.36 mg/dL — ABNORMAL LOW (ref 0.44–1.00)
GFR calc Af Amer: >60 mL/min (ref >60)
GFR calc non Af Amer: >60 mL/min (ref >60)
Glucose, Bld: 106 mg/dL — ABNORMAL HIGH (ref 70–99)
Potassium: 3.2 mmol/L — ABNORMAL LOW (ref 3.5–5.1)
Sodium: 140 mmol/L (ref 135–145)

## 2019-06-19 SURGERY — OPEN REDUCTION INTERNAL FIXATION (ORIF) DISTAL RADIUS FRACTURE
Anesthesia: Monitor Anesthesia Care | Site: Arm Lower | Laterality: Left

## 2019-06-19 MED ORDER — LIDOCAINE 2% (20 MG/ML) 5 ML SYRINGE
INTRAMUSCULAR | Status: AC
  Filled 2019-06-19: qty 5

## 2019-06-19 MED ORDER — MIDAZOLAM HCL 5 MG/5ML IJ SOLN
INTRAMUSCULAR | Status: DC | PRN
Start: 2019-06-19 — End: 2019-06-19
  Administered 2019-06-19 (×2): 1 mg via INTRAVENOUS

## 2019-06-19 MED ORDER — FENTANYL CITRATE (PF) 250 MCG/5ML IJ SOLN
INTRAMUSCULAR | Status: AC
  Filled 2019-06-19: qty 5

## 2019-06-19 MED ORDER — PROPOFOL 500 MG/50ML IV EMUL
INTRAVENOUS | Status: DC | PRN
  Administered 2019-06-19: 100 ug/kg/min via INTRAVENOUS

## 2019-06-19 MED ORDER — BUPIVACAINE HCL (PF) 0.25 % IJ SOLN
INTRAMUSCULAR | Status: AC
  Filled 2019-06-19: qty 30

## 2019-06-19 MED ORDER — MIDAZOLAM HCL 2 MG/2ML IJ SOLN
INTRAMUSCULAR | Status: AC
  Filled 2019-06-19: qty 2

## 2019-06-19 MED ORDER — ONDANSETRON HCL 4 MG/2ML IJ SOLN
INTRAMUSCULAR | Status: AC
  Filled 2019-06-19: qty 2

## 2019-06-19 MED ORDER — CHLORHEXIDINE GLUCONATE 0.12 % MT SOLN
15.0000 mL | Freq: Once | OROMUCOSAL | Status: AC
  Administered 2019-06-19: 15 mL via OROMUCOSAL

## 2019-06-19 MED ORDER — CEFAZOLIN SODIUM-DEXTROSE 2-4 GM/100ML-% IV SOLN
2.0000 g | Freq: Once | INTRAVENOUS | Status: AC
  Administered 2019-06-19: 2 mg via INTRAVENOUS

## 2019-06-19 MED ORDER — ROCURONIUM BROMIDE 10 MG/ML (PF) SYRINGE
PREFILLED_SYRINGE | INTRAVENOUS | Status: AC
  Filled 2019-06-19: qty 10

## 2019-06-19 MED ORDER — ONDANSETRON HCL 4 MG/2ML IJ SOLN: 4.0000 mg | Freq: Once | INTRAMUSCULAR | Status: DC | PRN

## 2019-06-19 MED ORDER — FENTANYL CITRATE (PF) 250 MCG/5ML IJ SOLN
INTRAMUSCULAR | Status: DC | PRN
  Administered 2019-06-19: 50 ug via INTRAVENOUS

## 2019-06-19 MED ORDER — DEXAMETHASONE SODIUM PHOSPHATE 4 MG/ML IJ SOLN
INTRAMUSCULAR | Status: DC | PRN
Start: 2019-06-19 — End: 2019-06-19
  Administered 2019-06-19: 4 mg via INTRAVENOUS

## 2019-06-19 MED ORDER — PROPOFOL 1000 MG/100ML IV EMUL
INTRAVENOUS | Status: AC
  Filled 2019-06-19: qty 100

## 2019-06-19 MED ORDER — SUCCINYLCHOLINE CHLORIDE 200 MG/10ML IV SOSY
PREFILLED_SYRINGE | INTRAVENOUS | Status: AC
  Filled 2019-06-19: qty 10

## 2019-06-19 MED ORDER — FENTANYL CITRATE (PF) 100 MCG/2ML IJ SOLN: 25.0000 ug | INTRAMUSCULAR | Status: DC | PRN

## 2019-06-19 MED ORDER — 0.9 % SODIUM CHLORIDE (POUR BTL) OPTIME
TOPICAL | Status: DC | PRN
  Administered 2019-06-19: 1000 mL

## 2019-06-19 MED ORDER — DEXAMETHASONE SODIUM PHOSPHATE 10 MG/ML IJ SOLN
INTRAMUSCULAR | Status: AC
  Filled 2019-06-19: qty 1

## 2019-06-19 MED ORDER — PROPOFOL 10 MG/ML IV BOLUS
INTRAVENOUS | Status: AC
  Filled 2019-06-19: qty 20

## 2019-06-19 SURGICAL SUPPLY — 62 items
ALCOHOL 70% 16 OZ (MISCELLANEOUS) ×2 IMPLANT
BIT DRILL 2.0 LNG QUCK RELEASE (BIT) ×1 IMPLANT
BIT DRILL 2.8 QUICK RELEASE (BIT) ×1 IMPLANT
BLADE CLIPPER SURG (BLADE) IMPLANT
BNDG ELASTIC 3X5.8 VLCR STR LF (GAUZE/BANDAGES/DRESSINGS) ×2 IMPLANT
BNDG ELASTIC 4X5.8 VLCR STR LF (GAUZE/BANDAGES/DRESSINGS) ×2 IMPLANT
BNDG ESMARK 4X9 LF (GAUZE/BANDAGES/DRESSINGS) ×2 IMPLANT
BNDG GAUZE ELAST 4 BULKY (GAUZE/BANDAGES/DRESSINGS) ×2 IMPLANT
CHLORAPREP W/TINT 26 (MISCELLANEOUS) ×2 IMPLANT
CLSR STERI-STRIP ANTIMIC 1/2X4 (GAUZE/BANDAGES/DRESSINGS) ×2 IMPLANT
CORD BIPOLAR FORCEPS 12FT (ELECTRODE) ×2 IMPLANT
COVER SURGICAL LIGHT HANDLE (MISCELLANEOUS) ×2 IMPLANT
COVER WAND RF STERILE (DRAPES) ×2 IMPLANT
CUFF TOURN SGL QUICK 18X4 (TOURNIQUET CUFF) ×2 IMPLANT
CUFF TOURN SGL QUICK 24 (TOURNIQUET CUFF)
CUFF TRNQT CYL 24X4X16.5-23 (TOURNIQUET CUFF) IMPLANT
DRAPE OEC MINIVIEW 54X84 (DRAPES) ×2 IMPLANT
DRAPE U-SHAPE 47X51 STRL (DRAPES) ×2 IMPLANT
DRILL 2.0 LNG QUICK RELEASE (BIT) ×2
DRILL 2.8 QUICK RELEASE (BIT) ×2
DRSG XEROFORM 1X8 (GAUZE/BANDAGES/DRESSINGS) ×2 IMPLANT
GAUZE SPONGE 4X4 12PLY STRL (GAUZE/BANDAGES/DRESSINGS) ×2 IMPLANT
GAUZE SPONGE 4X4 12PLY STRL LF (GAUZE/BANDAGES/DRESSINGS) ×2 IMPLANT
GAUZE XEROFORM 1X8 LF (GAUZE/BANDAGES/DRESSINGS) ×2 IMPLANT
GLOVE BIOGEL PI IND STRL 8 (GLOVE) ×1 IMPLANT
GLOVE BIOGEL PI INDICATOR 8 (GLOVE) ×1
GLOVE SURG SYN 7.5  E (GLOVE) ×1
GLOVE SURG SYN 7.5 E (GLOVE) ×1 IMPLANT
GOWN STRL REUS W/ TWL LRG LVL3 (GOWN DISPOSABLE) ×2 IMPLANT
GOWN STRL REUS W/TWL LRG LVL3 (GOWN DISPOSABLE) ×2
GUIDEWIRE ORTHO 0.054X6 (WIRE) ×8 IMPLANT
KIT BASIN OR (CUSTOM PROCEDURE TRAY) ×2 IMPLANT
KIT TURNOVER KIT B (KITS) ×2 IMPLANT
MANIFOLD NEPTUNE II (INSTRUMENTS) ×2 IMPLANT
NEEDLE HYPO 25GX1X1/2 BEV (NEEDLE) IMPLANT
NS IRRIG 1000ML POUR BTL (IV SOLUTION) ×2 IMPLANT
PACK ORTHO EXTREMITY (CUSTOM PROCEDURE TRAY) ×2 IMPLANT
PAD ARMBOARD 7.5X6 YLW CONV (MISCELLANEOUS) ×4 IMPLANT
PAD CAST 3X4 CTTN HI CHSV (CAST SUPPLIES) IMPLANT
PAD CAST 4YDX4 CTTN HI CHSV (CAST SUPPLIES) ×1 IMPLANT
PADDING CAST COTTON 3X4 STRL (CAST SUPPLIES)
PADDING CAST COTTON 4X4 STRL (CAST SUPPLIES) ×1
PLATE LEFT DIST RADIUS NARROW (Plate) ×2 IMPLANT
SCREW BN FT 16X2.3XLCK HEX CRT (Screw) ×2 IMPLANT
SCREW CORT FT 18X2.3XLCK HEX (Screw) ×4 IMPLANT
SCREW CORTICAL LOCKING 2.3X16M (Screw) ×2 IMPLANT
SCREW CORTICAL LOCKING 2.3X18M (Screw) ×4 IMPLANT
SCREW LOCK 12X3.5X HEXALOBE (Screw) ×1 IMPLANT
SCREW LOCKING 3.5X10MM (Screw) ×2 IMPLANT
SCREW LOCKING 3.5X12 (Screw) ×1 IMPLANT
SCREW NON TOGG 2.3X22MM (Screw) ×6 IMPLANT
SCREW NONLOCK HEX 3.5X12 (Screw) ×2 IMPLANT
SPLINT FIBERGLASS 3X12 (CAST SUPPLIES) ×2 IMPLANT
SPONGE LAP 4X18 RFD (DISPOSABLE) IMPLANT
SUT PROLENE 4 0 PS 2 18 (SUTURE) ×4 IMPLANT
SUT VIC AB 3-0 PS2 18 (SUTURE) IMPLANT
SYR CONTROL 10ML LL (SYRINGE) IMPLANT
TOWEL GREEN STERILE (TOWEL DISPOSABLE) ×2 IMPLANT
TOWEL GREEN STERILE FF (TOWEL DISPOSABLE) ×2 IMPLANT
TUBE CONNECTING 12X1/4 (SUCTIONS) ×2 IMPLANT
UNDERPAD 30X36 HEAVY ABSORB (UNDERPADS AND DIAPERS) ×2 IMPLANT
WATER STERILE IRR 1000ML POUR (IV SOLUTION) ×2 IMPLANT

## 2019-06-19 NOTE — Anesthesia Procedure Notes (Signed)
Anesthesia Regional Block: Axillary brachial plexus block   Pre-Anesthetic Checklist: ,, timeout performed, Correct Patient, Correct Site, Correct Laterality, Correct Procedure, Correct Position, site marked, Risks and benefits discussed,  Surgical consent,  Pre-op evaluation,  At surgeon's request and post-op pain management  Laterality: Left  Prep: chloraprep       Needles:  Injection technique: Single-shot      Additional Needles:   Procedures:,,,, ultrasound used (permanent image in chart),,,,  Narrative:  Start time: 06/19/2019 7:20 AM End time: 06/19/2019 7:25 AM Injection made incrementally with aspirations every 5 mL.  Performed by: Personally  Anesthesiologist: Kipp Brood, MD  Additional Notes: 30 cc 0.75% Ropivacaine 18 cc 0.5% Bupivacaine with 1:200 epi

## 2019-06-19 NOTE — Progress Notes (Signed)
   Ortho Hand Progress Note  Subjective: No acute events. Pain in left arm controlled.   Objective: Vital signs in last 24 hours: Temp:  [97.9 F (36.6 C)-99.3 F (37.4 C)] 98.5 F (36.9 C) (06/12 0531) Pulse Rate:  [64-101] 64 (06/12 0531) Resp:  [13-17] 16 (06/12 0531) BP: (120-172)/(64-94) 159/80 (06/12 0531) SpO2:  [96 %-100 %] 98 % (06/12 0531)  Intake/Output from previous day: 06/11 0701 - 06/12 0700 In: 2056.6 [P.O.:440; I.V.:1116.7; IV Piggyback:499.8] Out: 1100 [Urine:1100] Intake/Output this shift: No intake/output data recorded.  Recent Labs    06/17/19 1200 06/18/19 0450 06/19/19 0420  HGB 8.7* 9.2* 9.4*   Recent Labs    06/18/19 0450 06/19/19 0420  WBC 11.4* 9.9  RBC 3.01* 3.05*  HCT 27.6* 28.7*  PLT 214 282   Recent Labs    06/18/19 0450 06/19/19 0420  NA 139 140  K 3.0* 3.2*  CL 104 104  CO2 25 26  BUN <5* 7  CREATININE 0.37* 0.36*  GLUCOSE 111* 106*  CALCIUM 7.8* 8.1*   No results for input(s): LABPT, INR in the last 72 hours.  General: Alert, no acute distress Cardiovascular: No pedal edema Respiratory: No cyanosis, no use of accessory musculature Skin: No lesions in the area of chief complaint  MUSCULOSKELETAL: Left upper extremity is within a sugar tong splint.  There is mild edema to the exposed hand and fingers.  She has intact, gentle flexion and extension throughout all fingers.  Her fingertips are warm well perfused with brisk capillary refill.  She has intact sensation to the fingers as well.  Remainder of the exam is limited secondary to her recent extubation as well as splint application.  Assessment/Plan: Displaced and comminuted left distal radius fracture  -OR today for L DR ORIF -The risks, benefits and alternatives of the procedure were discussed with the patient today.  These risks include but are not limited to infection, bleeding, damage to surrounding structures including blood vessels and nerves, pain, stiffness,  malunion, nonunion, implant failure and need for additional procedures.  Informed consent was obtained to the left upper extremity was marked. -Nonweightbearing to left upper extremity -Plan for continued care under the trauma service postoperatively   Cain Saupe III 06/19/2019, 7:26 AM  [phone]
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
## 2019-06-19 NOTE — Anesthesia Preprocedure Evaluation (Signed)
Anesthesia Evaluation  Patient identified by MRN, date of birth, ID band Patient awake    Reviewed: Allergy & Precautions, NPO status , Patient's Chart, lab work & pertinent test results  Airway Mallampati: II  TM Distance: >3 FB Neck ROM: Full    Dental  (+) Poor Dentition   Pulmonary Current Smoker,    + rhonchi  + decreased breath sounds      Cardiovascular  Rhythm:Regular Rate:Normal     Neuro/Psych    GI/Hepatic   Endo/Other    Renal/GU      Musculoskeletal   Abdominal   Peds  Hematology   Anesthesia Other Findings   Reproductive/Obstetrics                             Anesthesia Physical Anesthesia Plan  ASA: III  Anesthesia Plan: MAC and Regional   Post-op Pain Management:    Induction: Intravenous  PONV Risk Score and Plan: Propofol infusion  Airway Management Planned: Natural Airway and Simple Face Mask  Additional Equipment:   Intra-op Plan:   Post-operative Plan:   Informed Consent: I have reviewed the patients History and Physical, chart, labs and discussed the procedure including the risks, benefits and alternatives for the proposed anesthesia with the patient or authorized representative who has indicated his/her understanding and acceptance.       Plan Discussed with: CRNA and Anesthesiologist  Anesthesia Plan Comments:         Anesthesia Quick Evaluation
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
## 2019-06-19 NOTE — Progress Notes (Signed)
PT Cancellation Note  Patient Details Name: Kelly Garrett MRN: [id number] DOB: [birth year]   Cancelled Treatment:      Therapy attempted to see patient in the morning. Patient was having right radial ORIF. Therapy unable to F/U in the afternoon. Therapy will F/U on 06/19/2019   [name] PT DPT  06/19/2019, 4:44 PM

## 2019-06-19 NOTE — Transfer of Care (Signed)
Immediate Anesthesia Transfer of Care Note  Patient: Kelly Garrett  Procedure(s) Performed: OPEN REDUCTION INTERNAL FIXATION (ORIF) DISTAL RADIAL FRACTURE (Left Arm Lower)  Patient Location: PACU  Anesthesia Type:MAC and Regional  Level of Consciousness: awake, alert , oriented and patient cooperative  Airway & Oxygen Therapy: Patient Spontanous Breathing and Patient connected to nasal cannula oxygen  Post-op Assessment: Report given to RN and Post -op Vital signs reviewed and stable  Post vital signs: Reviewed and stable  Last Vitals:  Vitals Value Taken Time  BP 153/69 06/19/19 0907  Temp    Pulse 81 06/19/19 0910  Resp 17 06/19/19 0910  SpO2 97 % 06/19/19 0910  Vitals shown include unvalidated device data.  Last Pain:  Vitals:   06/19/19 0642  TempSrc:   PainSc: 9          Complications: No complications documented.

## 2019-06-20 LAB — BASIC METABOLIC PANEL
Anion gap: 9 (ref 5–15)
BUN: 10 mg/dL (ref 6–20)
CO2: 26 mmol/L (ref 22–32)
Calcium: 8 mg/dL — ABNORMAL LOW (ref 8.9–10.3)
Chloride: 102 mmol/L (ref 98–111)
Creatinine, Ser: 0.4 mg/dL — ABNORMAL LOW (ref 0.44–1.00)
GFR calc Af Amer: >60 mL/min (ref >60)
GFR calc non Af Amer: >60 mL/min (ref >60)
Glucose, Bld: 94 mg/dL (ref 70–99)
Potassium: 3.5 mmol/L (ref 3.5–5.1)
Sodium: 137 mmol/L (ref 135–145)

## 2019-06-20 LAB — CBC
HCT: 28.7 % — ABNORMAL LOW (ref 36.0–46.0)
Hemoglobin: 9.3 g/dL — ABNORMAL LOW (ref 12.0–15.0)
MCH: 30.4 pg (ref 26.0–34.0)
MCHC: 32.4 g/dL (ref 30.0–36.0)
MCV: 93.8 fL (ref 80.0–100.0)
Platelets: 344 10*3/uL (ref 150–400)
RBC: 3.06 MIL/uL — ABNORMAL LOW (ref 3.87–5.11)
RDW: 15.1 % (ref 11.5–15.5)
WBC: 9.5 10*3/uL (ref 4.0–10.5)
nRBC: 0 % (ref 0.0–0.2)

## 2019-06-20 MED ORDER — ENOXAPARIN SODIUM 30 MG/0.3ML ~~LOC~~ SOLN
30.0000 mg | Freq: Two times a day (BID) | SUBCUTANEOUS | Status: DC
  Administered 2019-06-20 – 2019-06-28 (×17): 30 mg via SUBCUTANEOUS
  Filled 2019-06-20 (×17): qty 0.3

## 2019-06-20 MED ORDER — POLYETHYLENE GLYCOL 3350 17 G PO PACK
17.0000 g | PACK | Freq: Two times a day (BID) | ORAL | Status: DC
  Administered 2019-06-20 – 2019-06-27 (×15): 17 g via ORAL
  Filled 2019-06-20 (×16): qty 1

## 2019-06-20 MED ORDER — NICOTINE 21 MG/24HR TD PT24
21.0000 mg | MEDICATED_PATCH | Freq: Every day | TRANSDERMAL | Status: DC
  Administered 2019-06-20 – 2019-06-28 (×9): 21 mg via TRANSDERMAL
  Filled 2019-06-20 (×9): qty 1
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
## 2019-06-20 NOTE — Progress Notes (Signed)
Inpatient Rehab Admissions Coordinator:   Met with patient at bedside to discuss potential CIR admission. Pt. Stated interest. Will pursue for potential admit next week, pending bed availability.  Zyia Kaneko, MS, CCC-SLP Rehab Admissions Coordinator  [phone] (celll) [phone] (office) 
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
## 2019-06-20 NOTE — Plan of Care (Signed)
  Problem: Education: Goal: Knowledge of General Education information will improve Description: Including pain rating scale, medication(s)/side effects and non-pharmacologic comfort measures Outcome: Progressing   Problem: Health Behavior/Discharge Planning: Goal: Ability to manage health-related needs will improve Outcome: Progressing   Problem: Clinical Measurements: Goal: Respiratory complications will improve Outcome: Progressing   Problem: Activity: Goal: Risk for activity intolerance will decrease Outcome: Progressing   Problem: Elimination: Goal: Will not experience complications related to bowel motility Outcome: Progressing Goal: Will not experience complications related to urinary retention Outcome: Progressing   Problem: Pain Managment: Goal: General experience of comfort will improve Outcome: Progressing   Problem: Safety: Goal: Ability to remain free from injury will improve Outcome: Progressing   Problem: Skin Integrity: Goal: Risk for impaired skin integrity will decrease Outcome: Progressing

## 2019-06-20 NOTE — Anesthesia Postprocedure Evaluation (Signed)
Anesthesia Post Note  Patient: Kelly Garrett  Procedure(s) Performed: OPEN REDUCTION INTERNAL FIXATION (ORIF) DISTAL RADIAL FRACTURE (Left Arm Lower)     Patient location during evaluation: PACU Anesthesia Type: Regional Level of consciousness: awake and alert Pain management: pain level controlled Vital Signs Assessment: post-procedure vital signs reviewed and stable Respiratory status: spontaneous breathing, nonlabored ventilation, respiratory function stable and patient connected to nasal cannula oxygen Cardiovascular status: stable and blood pressure returned to baseline Postop Assessment: no apparent nausea or vomiting Anesthetic complications: no   No complications documented.  Last Vitals:  Vitals:   06/19/19 1628 06/19/19 2004  BP: 135/76 (!) 159/73  Pulse: 81 79  Resp: 18 16  Temp: 36.9 C 37.2 C  SpO2: 94% 93%    Last Pain:  Vitals:   06/19/19 2018  TempSrc:   PainSc: Asleep                 Iniya Matzek COKER

## 2019-06-21 LAB — BASIC METABOLIC PANEL
Anion gap: 7 (ref 5–15)
BUN: 7 mg/dL (ref 6–20)
CO2: 25 mmol/L (ref 22–32)
Calcium: 8.1 mg/dL — ABNORMAL LOW (ref 8.9–10.3)
Chloride: 106 mmol/L (ref 98–111)
Creatinine, Ser: 0.43 mg/dL — ABNORMAL LOW (ref 0.44–1.00)
GFR calc Af Amer: >60 mL/min (ref >60)
GFR calc non Af Amer: >60 mL/min (ref >60)
Glucose, Bld: 88 mg/dL (ref 70–99)
Potassium: 3.6 mmol/L (ref 3.5–5.1)
Sodium: 138 mmol/L (ref 135–145)

## 2019-06-21 LAB — CBC
HCT: 29.6 % — ABNORMAL LOW (ref 36.0–46.0)
Hemoglobin: 9.5 g/dL — ABNORMAL LOW (ref 12.0–15.0)
MCH: 30.5 pg (ref 26.0–34.0)
MCHC: 32.1 g/dL (ref 30.0–36.0)
MCV: 95.2 fL (ref 80.0–100.0)
Platelets: 370 10*3/uL (ref 150–400)
RBC: 3.11 MIL/uL — ABNORMAL LOW (ref 3.87–5.11)
RDW: 15.2 % (ref 11.5–15.5)
WBC: 8.5 10*3/uL (ref 4.0–10.5)
nRBC: 0 % (ref 0.0–0.2)

## 2019-06-21 LAB — MAGNESIUM: Magnesium: 1.9 mg/dL (ref 1.7–2.4)

## 2019-06-21 MED ORDER — MAGNESIUM CITRATE PO SOLN
1.0000 | Freq: Once | ORAL | Status: AC
  Administered 2019-06-21: 1 via ORAL
  Filled 2019-06-21: qty 296

## 2019-06-21 MED ORDER — POTASSIUM CHLORIDE CRYS ER 20 MEQ PO TBCR
40.0000 meq | EXTENDED_RELEASE_TABLET | Freq: Two times a day (BID) | ORAL | Status: AC
  Administered 2019-06-21 (×2): 40 meq via ORAL
  Filled 2019-06-21 (×2): qty 2

## 2019-06-21 MED ORDER — METHOCARBAMOL 500 MG PO TABS
1000.0000 mg | ORAL_TABLET | Freq: Four times a day (QID) | ORAL | Status: DC
  Administered 2019-06-21 – 2019-06-28 (×30): 1000 mg via ORAL
  Filled 2019-06-21 (×29): qty 2

## 2019-06-21 MED ORDER — HYDROMORPHONE HCL 1 MG/ML IJ SOLN
2.0000 mg | Freq: Three times a day (TID) | INTRAMUSCULAR | Status: DC | PRN
  Administered 2019-06-21 – 2019-06-23 (×7): 2 mg via INTRAVENOUS
  Filled 2019-06-21 (×6): qty 2
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
## 2019-06-21 NOTE — Progress Notes (Signed)
Inpatient Rehabilitation Admissions Coordinator  We will begin insurance authorization with Medicaid of [place]. I met wit patient at bedside and she is aware. I will follow up tomorrow.  Danne Baxter, RN, MSN Rehab Admissions Coordinator [phone] 06/21/2019 2:58 PM

## 2019-06-21 NOTE — Progress Notes (Signed)
Subjective: The patient is alert and pleasant.  She complains of the appropriate amount of back pain.  She has been ambulating.  Objective: Vital signs in last 24 hours: Temp:  [98.2 F (36.8 C)-98.8 F (37.1 C)] 98.8 F (37.1 C) (06/13 2012) Pulse Rate:  [63-84] 84 (06/13 2012) Resp:  [16-17] 16 (06/13 2012) BP: (140-145)/(70-71) 145/70 (06/13 2012) SpO2:  [93 %-94 %] 94 % (06/13 2012) Weight:  [61.8 kg] 61.8 kg (06/14 0500) Estimated body mass index is 23.39 kg/m as calculated from the following:   Height as of this encounter: 5\' 4"  (1.626 m).   Weight as of this encounter: 61.8 kg.   Intake/Output from previous day: 06/13 0701 - 06/14 0700 In: -  Out: 2000 [Urine:2000] Intake/Output this shift: No intake/output data recorded.  Physical exam the patient is alert and oriented x3.  Her incision is healing well.  I removed the dressing.  Her lower extremity strength is grossly normal.  Lab Results: Recent Labs    06/20/19 0338 06/21/19 0351  WBC 9.5 8.5  HGB 9.3* 9.5*  HCT 28.7* 29.6*  PLT 344 370   BMET Recent Labs    06/20/19 0338 06/21/19 0351  NA 137 138  K 3.5 3.6  CL 102 106  CO2 26 25  GLUCOSE 94 88  BUN 10 7  CREATININE 0.40* 0.43*  CALCIUM 8.0* 8.1*    Studies/Results: No results found.  Assessment/Plan: Postop day #5: The patient is doing well.  She is awaiting rehab.  I instructed her on her TLSO use and answered all her questions.  LOS: 11 days     06/23/19 06/21/2019, 8:18 AM
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
## 2019-06-21 NOTE — Progress Notes (Signed)
PT Cancellation Note  Patient Details Name: Kelly Garrett MRN: [id number] DOB: Dec 23, 1990   Cancelled Treatment:    Reason Eval/Treat Not Completed: Other (comment).  Three attempts were made, with pt awaiting meds, awaiting care and in a meeting with staff.  Will re-attempt as time and pt allow.   Ivar Drape 06/21/2019, 4:08 PM   Samul Dada, PT MS Acute Rehab Dept. Number: [facility] [id number] and [facility] [phone]

## 2019-06-22 ENCOUNTER — Encounter (HOSPITAL_COMMUNITY): Payer: Self-pay | Admitting: Orthopaedic Surgery

## 2019-06-22 LAB — CBC
HCT: 30.1 % — ABNORMAL LOW (ref 36.0–46.0)
Hemoglobin: 9.7 g/dL — ABNORMAL LOW (ref 12.0–15.0)
MCH: 30.9 pg (ref 26.0–34.0)
MCHC: 32.2 g/dL (ref 30.0–36.0)
MCV: 95.9 fL (ref 80.0–100.0)
Platelets: 391 10*3/uL (ref 150–400)
RBC: 3.14 MIL/uL — ABNORMAL LOW (ref 3.87–5.11)
RDW: 15 % (ref 11.5–15.5)
WBC: 7.6 10*3/uL (ref 4.0–10.5)
nRBC: 0 % (ref 0.0–0.2)

## 2019-06-22 LAB — BASIC METABOLIC PANEL
Anion gap: 6 (ref 5–15)
BUN: 6 mg/dL (ref 6–20)
CO2: 26 mmol/L (ref 22–32)
Calcium: 8.5 mg/dL — ABNORMAL LOW (ref 8.9–10.3)
Chloride: 104 mmol/L (ref 98–111)
Creatinine, Ser: 0.44 mg/dL (ref 0.44–1.00)
GFR calc Af Amer: >60 mL/min (ref >60)
GFR calc non Af Amer: >60 mL/min (ref >60)
Glucose, Bld: 104 mg/dL — ABNORMAL HIGH (ref 70–99)
Potassium: 4.6 mmol/L (ref 3.5–5.1)
Sodium: 136 mmol/L (ref 135–145)

## 2019-06-22 MED ORDER — LIDOCAINE 5 % EX PTCH
2.0000 | MEDICATED_PATCH | CUTANEOUS | Status: DC
  Administered 2019-06-22: 2 via TRANSDERMAL
  Filled 2019-06-22 (×5): qty 2

## 2019-06-22 MED ORDER — ADULT MULTIVITAMIN W/MINERALS CH
1.0000 | ORAL_TABLET | Freq: Every day | ORAL | Status: DC
  Administered 2019-06-22 – 2019-06-28 (×7): 1 via ORAL
  Filled 2019-06-22 (×7): qty 1

## 2019-06-22 MED FILL — Heparin Sodium (Porcine) Inj 1000 Unit/ML: INTRAMUSCULAR | Qty: 30 | Status: AC

## 2019-06-22 MED FILL — Sodium Chloride IV Soln 0.9%: INTRAVENOUS | Qty: 1000 | Status: AC
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
## 2019-06-22 NOTE — Progress Notes (Signed)
Inpatient Rehabilitation Admissions Coordinator  I met with patient at bedside to let her know that her Medicaid of [place] is not in network with our CIR. She asked about charity care and I explained that I do not have a bed likely this week, nor can I guarantee that she would receive it as [facility] care. She states she is only able to sit in chair for about 20 minutes due to her pain, but does better when walking. States her Mom and sister would come from [place] to pick her up, but the return ride home is about 7 hours. I have encouraged her to get up as as much possible to hopefully mobilize better over the next few days to go home. I have updated TOC team, Almyra Free. I will ask therapy if they can up their frequency to assist with her functional gains. Please call me with any questions.   Danne Baxter, RN, MSN Rehab Admissions Coordinator [phone] 06/22/2019 6:35 PM
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
## 2019-06-23 MED ORDER — SENNOSIDES-DOCUSATE SODIUM 8.6-50 MG PO TABS
2.0000 | ORAL_TABLET | Freq: Two times a day (BID) | ORAL | Status: DC
  Administered 2019-06-23 – 2019-06-28 (×10): 2 via ORAL
  Filled 2019-06-23 (×11): qty 2

## 2019-06-23 NOTE — Progress Notes (Signed)
Subjective: The patient is alert and pleasant.  She had questions about her TLSO.  Objective: Vital signs in last 24 hours: Temp:  [98 F (36.7 C)-98.1 F (36.7 C)] 98.1 F (36.7 C) (06/16 0400) Pulse Rate:  [52-71] 52 (06/16 0400) Resp:  [17-18] 18 (06/16 0400) BP: (117-153)/(76-94) 149/94 (06/16 0400) SpO2:  [95 %-98 %] 95 % (06/16 0400) Weight:  [61.4 kg] 61.4 kg (06/16 0500) Estimated body mass index is 23.23 kg/m as calculated from the following:   Height as of this encounter: 5\' 4"  (1.626 m).   Weight as of this encounter: 61.4 kg.   Intake/Output from previous day: 06/15 0701 - 06/16 0700 In: 1469.3 [P.O.:470; I.V.:999.3] Out: 1900 [Urine:1900] Intake/Output this shift: No intake/output data recorded.  Physical exam the patient is alert and oriented.  Her lower extremity strength is normal.  Her thoracolumbar wound is healing well.  Lab Results: Recent Labs    06/21/19 0351 06/22/19 0426  WBC 8.5 7.6  HGB 9.5* 9.7*  HCT 29.6* 30.1*  PLT 370 391   BMET Recent Labs    06/21/19 0351 06/22/19 0426  NA 138 136  K 3.6 4.6  CL 106 104  CO2 25 26  GLUCOSE 88 104*  BUN 7 6  CREATININE 0.43* 0.44  CALCIUM 8.1* 8.5*    Studies/Results: No results found.  Assessment/Plan: L1 burst fracture status post decompression, instrumentation and fusion: The patient is recovering well.  I answered all her questions regarding her TLSO.  I told her she could take a shower while avoiding bending and twisting without her TLSO on.  I told her she should wear her TLSO while up and about for approximately 12 weeks.  She will need to follow-up with a neurosurgeon in 06/24/19 for follow-up x-rays.  I will sign off.  Please call if I can be of further assistance.  LOS: 13 days     [place] 06/23/2019, 9:01 AM
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
## 2019-06-23 NOTE — Plan of Care (Signed)
  Problem: Education: Goal: Knowledge of General Education information will improve Description: Including pain rating scale, medication(s)/side effects and non-pharmacologic comfort measures Outcome: Progressing   Problem: Health Behavior/Discharge Planning: Goal: Ability to manage health-related needs will improve Outcome: Progressing   Problem: Clinical Measurements: Goal: Ability to maintain clinical measurements within normal limits will improve Outcome: Progressing Goal: Respiratory complications will improve Outcome: Progressing   Problem: Activity: Goal: Risk for activity intolerance will decrease Outcome: Progressing   Problem: Elimination: Goal: Will not experience complications related to bowel motility Outcome: Progressing Goal: Will not experience complications related to urinary retention Outcome: Progressing   Problem: Pain Managment: Goal: General experience of comfort will improve Outcome: Progressing   Problem: Safety: Goal: Ability to remain free from injury will improve Outcome: Progressing   Problem: Skin Integrity: Goal: Risk for impaired skin integrity will decrease Outcome: Progressing   

## 2019-06-23 NOTE — Progress Notes (Signed)
Pressure Redistribution pad ordered for pt so when she travels back to [place], she can sit on it.
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
## 2019-06-23 NOTE — Progress Notes (Addendum)
Inpatient Rehabilitation Admissions Coordinator  Received a voicemail from pt's Mom requesting a call back. I spoke with her by phone and updated her on her daughter's rehab recovery. She is concerned about the 8 hour drive to return to [place] and is requesting therapy be set up in pt's home prior to her discharge. I will updated TOC, Raynelle Fanning. We will sign off at this time.  Ottie Glazier, RN, MSN Rehab Admissions Coordinator [phone] 06/23/2019 10:52 AM

## 2019-06-24 MED ORDER — HYDROMORPHONE HCL 1 MG/ML IJ SOLN
0.5000 mg | Freq: Three times a day (TID) | INTRAMUSCULAR | Status: DC | PRN
  Administered 2019-06-24: 0.5 mg via INTRAVENOUS
  Filled 2019-06-24: qty 1

## 2019-06-24 MED ORDER — HYDROMORPHONE HCL 2 MG PO TABS
2.0000 mg | ORAL_TABLET | ORAL | Status: DC | PRN
  Administered 2019-06-24 – 2019-06-28 (×22): 2 mg via ORAL
  Filled 2019-06-24 (×23): qty 1

## 2019-06-24 MED ORDER — MAGNESIUM CITRATE PO SOLN
1.0000 | Freq: Once | ORAL | Status: AC
  Administered 2019-06-24: 1 via ORAL
  Filled 2019-06-24: qty 296

## 2019-06-24 MED ORDER — BOOST / RESOURCE BREEZE PO LIQD CUSTOM
1.0000 | Freq: Two times a day (BID) | ORAL | Status: DC
  Administered 2019-06-24: 1 via ORAL
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
## 2019-06-24 NOTE — Progress Notes (Signed)
Pt increasingly lethargic responsive only to painful stimuli with brief eye opening observed w/o eye contact and largely non-responsive to any verbal questions provided by Primary RN.  Pt only responded when asked about pain level with an answer of "9.5" and when asked what she would like for tx. By Primary RN, pt responded with only the world "Dilaudid" unable to answer any other questions.   White MD paged regarding increased difficulty arousing/moderate sedation.  MD notified of patient's impaired neuro status as well as pertinent medications the patient is receiving.  White MD instructed to HOLD all opiates or benzodiazepines until medically appropriate and MD will decrease prescribed Dilaudid dosage.  MD to also add PRN Naloxone. Pt's vitals assessed w/ some bradypnea and bradycardia, otherwise all other VSS.  Pt remains stable with no change in status since initial assessment.  Will con't to monitor.

## 2019-06-24 NOTE — Progress Notes (Addendum)
   06/24/19 0016  Assess: MEWS Score  Temp 97.7 F (36.5 C)  BP 105/71  Pulse Rate (!) 54  Resp 12  Level of Consciousness Responds to Pain  SpO2 97 %  O2 Device Room Air  Assess: MEWS Score  MEWS Temp 0  MEWS Systolic 0  MEWS Pulse 0  MEWS RR 1  MEWS LOC 2  MEWS Score 3  MEWS Score Color Yellow  Assess: if the MEWS score is Yellow or Red  Were vital signs taken at a resting state? Yes  Focused Assessment Documented focused assessment  Early Detection of Sepsis Score *See Row Information* Low  MEWS guidelines implemented *See Row Information* Yes  Treat  MEWS Interventions Escalated (See documentation below)  Take Vital Signs  Increase Vital Sign Frequency  Yellow: Q 2hr X 2 then Q 4hr X 2, if remains yellow, continue Q 4hrs  Escalate  MEWS: Escalate Yellow: discuss with charge nurse/RN and consider discussing with provider and RRT  Notify: Charge Nurse/RN  Name of Charge Nurse/RN Notified Celso RN  Date Charge Nurse/RN Notified 06/24/19  Time Charge Nurse/RN Notified 0045  Pt increasingly lethargic responsive only to painful stimuli with brief eye opening observed w/o eye contact and largely non-responsive to any verbal questions provided by Primary RN.  Pt only responded when asked about pain level with an answer of "9.5" and when asked what she would like for tx. By Primary RN, pt responded with only the world "Dilaudid" unable to answer any other questions.   White MD paged regarding increased difficulty arousing/moderate sedation.  MD notified of patient's impaired neuro status as well as pertinent medications the patient is receiving.  White MD instructed to HOLD all opiates or benzodiazepines until medically appropriate and MD will decrease prescribed Dilaudid dosage.  MD to also add PRN Naloxone. Pt's vitals assessed w/ some bradypnea and bradycardia, otherwise all other VSS.  Pt remains stable with no change in status since initial assessment.  Will con't to monitor.    0423: Patient awake and alert at baseline cognitive status AAOx4 complaining of severe pain of the ribs and lower lumbar.  Pt received PRN 0.5mg  of Dilaudid and 15mg  of oxycodone w/ no other needs reported at this time.  Will con't to monitor pt hourly throughout the shift.
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
## 2019-06-24 NOTE — Progress Notes (Signed)
   Ortho Hand Progress Note  Subjective: No acute events. Minimal pain in left wrist. Continue paresthesias to the thumb and index finger  Objective: Vital signs in last 24 hours: Temp:  [97.6 F (36.4 C)-98 F (36.7 C)] 97.9 F (36.6 C) (06/17 1400) Pulse Rate:  [46-72] 63 (06/17 1400) Resp:  [12-19] 17 (06/17 1400) BP: (105-155)/(67-86) 132/69 (06/17 1400) SpO2:  [96 %-100 %] 98 % (06/17 1400)  Intake/Output from previous day: 06/16 0701 - 06/17 0700 In: 1749.9 [P.O.:510; I.V.:1239.9] Out: 2150 [Urine:2150] Intake/Output this shift: Total I/O In: 357 [P.O.:357] Out: 900 [Urine:900]  Recent Labs    06/22/19 0426  HGB 9.7*   Recent Labs    06/22/19 0426  WBC 7.6  RBC 3.14*  HCT 30.1*  PLT 391   Recent Labs    06/22/19 0426  NA 136  K 4.6  CL 104  CO2 26  BUN 6  CREATININE 0.44  GLUCOSE 104*  CALCIUM 8.5*   No results for input(s): LABPT, INR in the last 72 hours.  General: Alert, no acute distress Cardiovascular: No edema Respiratory: No cyanosis, no use of accessory musculature Skin: No lesions in the area of chief complaint  MUSCULOSKELETAL: LUE in volar slab splint. Full flexion and extension of the digits. Finger tips all wwp with bcr. SILT to all fingers including the thumb and index finger  Assessment/Plan: Displaced and comminuted left distal radius fracture s/p ORIF on 6/12  - Continue current splint - NWB through the L hand. OK for platform walker if needed - Encourage LUE elevation, digit motion and ice packs as needed to help with swelling - Remainder of care per primary  Will need follow up 10-14 days post operatively for splint and suture removal. Can be transitioned into a removable wrist splint and start wrist ROM at that point. Patient states she is planning on returning to her home in [place], [place]. [facility] is located in [place] and could be an option for her pending insurance.  Cain Saupe III 06/24/2019,  3:08 PM  [phone]

## 2019-06-25 MED ORDER — TRAMADOL HCL 50 MG PO TABS: 100.0000 mg | ORAL_TABLET | Freq: Four times a day (QID) | ORAL | 0 refills | Status: AC

## 2019-06-25 MED ORDER — HYDROMORPHONE HCL 2 MG PO TABS: 2.0000 mg | ORAL_TABLET | ORAL | 0 refills | Status: AC | PRN

## 2019-06-25 MED ORDER — METHOCARBAMOL 500 MG PO TABS: 1000.0000 mg | ORAL_TABLET | Freq: Four times a day (QID) | ORAL | 0 refills | Status: AC

## 2019-06-25 MED ORDER — POLYETHYLENE GLYCOL 3350 17 G PO PACK: 17.0000 g | PACK | Freq: Two times a day (BID) | ORAL | 0 refills | Status: AC

## 2019-06-25 MED ORDER — ACETAMINOPHEN 500 MG PO TABS: 1000.0000 mg | ORAL_TABLET | Freq: Four times a day (QID) | ORAL | 0 refills | Status: AC

## 2019-06-25 MED ORDER — LIDOCAINE 5 % EX PTCH: 2.0000 | MEDICATED_PATCH | CUTANEOUS | 0 refills | Status: AC

## 2019-06-25 MED ORDER — GABAPENTIN 400 MG PO CAPS: 800.0000 mg | ORAL_CAPSULE | Freq: Four times a day (QID) | ORAL | 0 refills | Status: AC | PRN

## 2019-06-25 MED FILL — HYDROmorphone HCL 2 MG TABS: 2 | 5 days supply | Qty: 30 | Fill #0

## 2019-06-25 MED FILL — METHOCARBAMOL 500 MG TABS: 500 | 8 days supply | Qty: 64 | Fill #0

## 2019-06-25 MED FILL — traMADol HCL 50 MG TABS: 50 | 30 days supply | Qty: 30 | Fill #0

## 2019-06-25 MED FILL — GABAPENTIN 400 MG CAPSULE: 400 | 8 days supply | Qty: 64 | Fill #0

## 2019-06-25 NOTE — TOC Progression Note (Signed)
Transition of Care [facility]) - Progression Note    Patient Details  Name: Kelly Garrett MRN: [id number] Date of Birth: [birth year]  Transition [facility]) CM/SW Contact  Glennon Mac, RN Phone Number: 06/25/2019, 4:38 PM  Clinical Narrative:  Pt unable to discharge tomorrow as her ride has fell through.  She is certain that she will have transport home by mom and sister on Monday.  Left message for triage nurse at [facility] in [place], [place], requesting assistance with [facility] arrangements for patient.  Follow up appt made for June 24 at 4:00 with PCP.   I spoke with pt's mother, Britta Mccreedy, who confirms that pt will be discharged to her care on Monday.  She states that she has access to [facility] and will make sure that she locates it prior to pt's return home.  We discussed her follow up appt on June 24, and mom states she will get her there.  TOC Pharmacy to fill pt's medications early Monday AM; cost of medications will be $29, and mother states she will make sure that they have the money to pay for meds.  TOC Case Manager will follow up with pt's PCP on Monday AM to discuss [facility] arrangements; if unable to arrange, mom states she will discuss [facility] arrangements with PCP at Thursday's appointment.  Mom appreciative of help, and states is ready to get her daughter back to [place].      Expected Discharge Plan: Home w Home Health Services Barriers to Discharge: Continued Medical Work up  Expected Discharge Plan and Services Expected Discharge Plan: Home w Home Health Services   Discharge Planning Services: CM Consult Post Acute Care Choice: Home Health Living arrangements for the past 2 months: Single Family Home                                       Social Determinants of Health (SDOH) Interventions    Readmission Risk Interventions No flowsheet data found.  Quintella Baton, RN, BSN  Trauma/Neuro ICU Case Manager [phone]

## 2019-06-25 NOTE — Progress Notes (Signed)
Patient prefers her d/c medications from [facility] without lidocaine patches.
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
## 2019-06-25 NOTE — Discharge Instructions (Signed)
Displaced and comminuted left distal radius fracture s/p ORIF on 6/12  - Continue current splint - NWB through the L hand. OK for platform walker if needed - Encourage LUE elevation, digit motion and ice packs as needed to help with swelling  L1 Burst FX  L1 burst fracture status post decompression, instrumentation and fusion: The patient is recovering well.  I answered all her questions regarding her TLSO.  I told her she could take a shower while avoiding bending and twisting without her TLSO on.  I told her she should wear her TLSO while up and about for approximately 12 weeks.  She will need to follow-up with a neurosurgeon in [place] for follow-up x-rays.    Rib Fracture  A rib fracture is a break or crack in one of the bones of the ribs. The ribs are like a cage that goes around your upper chest. A broken or cracked rib is often painful, but most do not cause other problems. Most rib fractures usually heal on their own in 1-3 months. Follow these instructions at home: Managing pain, stiffness, and swelling  If directed, apply ice to the injured area. ? Put ice in a plastic bag. ? Place a towel between your skin and the bag. ? Leave the ice on for 20 minutes, 2-3 times a day.  Take over-the-counter and prescription medicines only as told by your doctor. Activity  Avoid activities that cause pain to the injured area. Protect your injured area.  Slowly increase activity as told by your doctor. General instructions  Do deep breathing as told by your doctor. You may be told to: ? Take deep breaths many times a day. ? Cough many times a day while hugging a pillow. ? Use a device (incentive spirometer) to do deep breathing many times a day.  Drink enough fluid to keep your pee (urine) clear or pale yellow.  Do not wear a rib belt or binder. These do not allow you to breathe deeply.  Keep all follow-up visits as told by your doctor. This is important. Contact a doctor if:  You  have a fever. Get help right away if:  You have trouble breathing.  You are short of breath.  You cannot stop coughing.  You cough up thick or bloody spit (sputum).  You feel sick to your stomach (nauseous), throw up (vomit), or have belly (abdominal) pain.  Your pain gets worse and medicine does not help. Summary  A rib fracture is a break or crack in one of the bones of the ribs.  Apply ice to the injured area and take medicines for pain as told by your doctor.  Take deep breaths and cough many times a day. Hug a pillow every time you cough. This information is not intended to replace advice given to you by your health care provider. Make sure you discuss any questions you have with your health care provider. Document Revised: 12/06/2016 Document Reviewed: 03/26/2016 Elsevier Patient Education  2020 [employer]   Liver Laceration  A liver laceration is a tear or a cut in the liver. The liver is an organ that is involved in many important bodily functions. Sometimes, a liver laceration can be a very serious injury. It can cause a lot of bleeding, and surgery may be needed. Other times, a liver laceration may be minor, and bed rest may be all that is needed. Either way, treatment in a hospital is almost always required. Liver lacerations are categorized in grades  from 1 to 5. Low numbers identify lacerations that are less severe than lacerations with high numbers.  Grade 1: This is a tear in the outer lining of the liver. It is less than  inch (1 cm) deep.  Grade 2: This is a tear that is about  inch to 1 inch (1 to 3 cm) deep. It is less than 4 inches (10 cm) long.  Grade 3: This is a tear that is slightly more than 1 inch (3 cm) deep.  Grades 4 and 5: These lacerations are very deep. They affect a large part of the liver. What are the causes? This condition may be caused by:  A forceful hit to the area around the liver (blunt trauma), such as in a car crash. Blunt  trauma can tear the liver even though it does not break the skin.  An injury in which an object goes through the skin and into the liver (penetrating injury), such as a stab or gunshot wound. What are the signs or symptoms? Common symptoms of this condition include:  A swollen and firm abdomen.  Pain in the abdomen.  Tenderness when pressing on the right side of the abdomen. Other symptoms include:  Bleeding from a penetrating wound.  Bruises on the abdomen.  A fast heartbeat.  Taking quick breaths.  Feeling weak and dizzy. How is this diagnosed? To diagnose this condition, your health care provider will do a physical exam and ask about any injuries to the right side of your abdomen. You may have various tests, such as:  Blood tests. Your blood may be tested every few hours. This will show whether you are losing blood.  CT scan. This test is done to check for laceration or bleeding.  Laparoscopy. This involves placing a small camera into the abdomen and looking directly at the surface of the liver. How is this treated? Treatment depends on how deep the laceration is and how much bleeding you have. Treatment options include:  Monitoring and bed rest at the hospital. You will have tests often.  Receiving donated blood through an IV tube (transfusion) to replace blood that you have lost. You may need several transfusions.  Surgery to pack gauze pads or special material around the laceration to help it heal or to repair the laceration. Follow these instructions at home:  Take over-the-counter and prescription medicines only as told by your health care provider. Do not take any other medicines unless you ask your health care provider about them first.  Do not drive or use heavy machinery while taking prescription pain medicines.  Rest and limit your activity as told by your health care provider. It may be several months before you can return to your usual routine. Do not  participate in activities that involve physical contact or require extra energy until your health care provider approves.  Keep all follow-up visits as told by your health care provider. This is important. Contact a health care provider if:  Your abdominal pain does not go away.  You feel more weak and tired than usual. Get help right away if:  Your abdominal pain gets worse.  You have a cut on your skin that: ? Has more redness, swelling, or pain around it. ? Has more fluid or blood coming from it. ? Feels warm to the touch. ? Has pus or a bad smell coming from it.  You feel dizzy or very weak.  You have trouble breathing.  You have a fever. This  information is not intended to replace advice given to you by your health care provider. Make sure you discuss any questions you have with your health care provider. Document Revised: 12/06/2016 Document Reviewed: 08/11/2015 Elsevier Patient Education  2020 [employer].
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
## 2019-06-27 LAB — COMPREHENSIVE METABOLIC PANEL
ALT: 19 U/L (ref 0–44)
AST: 18 U/L (ref 15–41)
Albumin: 3.5 g/dL (ref 3.5–5.0)
Alkaline Phosphatase: 81 U/L (ref 38–126)
Anion gap: 11 (ref 5–15)
BUN: 13 mg/dL (ref 6–20)
CO2: 26 mmol/L (ref 22–32)
Calcium: 9.3 mg/dL (ref 8.9–10.3)
Chloride: 104 mmol/L (ref 98–111)
Creatinine, Ser: 0.62 mg/dL (ref 0.44–1.00)
GFR calc Af Amer: >60 mL/min (ref >60)
GFR calc non Af Amer: >60 mL/min (ref >60)
Glucose, Bld: 84 mg/dL (ref 70–99)
Potassium: 4 mmol/L (ref 3.5–5.1)
Sodium: 141 mmol/L (ref 135–145)
Total Bilirubin: 0.7 mg/dL (ref 0.3–1.2)
Total Protein: 7.8 g/dL (ref 6.5–8.1)

## 2019-06-27 LAB — CBC
HCT: 39.9 % (ref 36.0–46.0)
Hemoglobin: 12.4 g/dL (ref 12.0–15.0)
MCH: 30.1 pg (ref 26.0–34.0)
MCHC: 31.1 g/dL (ref 30.0–36.0)
MCV: 96.8 fL (ref 80.0–100.0)
Platelets: 427 10*3/uL — ABNORMAL HIGH (ref 150–400)
RBC: 4.12 MIL/uL (ref 3.87–5.11)
RDW: 15.4 % (ref 11.5–15.5)
WBC: 5.8 10*3/uL (ref 4.0–10.5)
nRBC: 0 % (ref 0.0–0.2)

## 2019-06-27 MED ORDER — HYDROMORPHONE HCL 1 MG/ML IJ SOLN
1.0000 mg | INTRAMUSCULAR | Status: DC | PRN
  Administered 2019-06-27 – 2019-06-28 (×4): 1 mg via INTRAVENOUS
  Filled 2019-06-27 (×4): qty 1
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
## 2019-06-28 MED ORDER — ALPRAZOLAM 2 MG PO TABS: 2.0000 mg | ORAL_TABLET | Freq: Two times a day (BID) | ORAL | 0 refills | Status: AC | PRN

## 2019-06-28 MED ORDER — HYDROMORPHONE HCL 1 MG/ML IJ SOLN
1.0000 mg | Freq: Once | INTRAMUSCULAR | Status: AC
  Administered 2019-06-28: 1 mg via INTRAVENOUS
  Filled 2019-06-28: qty 1

## 2019-06-28 MED FILL — ALPRAZolam 1 MG TABS: 1 | 6 days supply | Qty: 12 | Fill #0

## 2019-06-28 NOTE — Progress Notes (Signed)
Patient refusing to leave the hospital, demanding RX for xanax. Patient advised that we do not write prescriptions for home medications that are controlled substances. Called her psychiatrist, who she states writes her xanax, and their office states the patient was discharged from their practice on 05/27/2019 due to "non-compliance with medication regimens and medication seeking suggestive of addiction". Patient has been scheduled for follow-up with her PCP on 07/01/2019 and will be provided an RX for 2mg  BID of xanax, which her psychiatrist's office states was her most recently prescribed dose. Even when the patient was notified that she would be prescribed this at discharge, she insisted on a doubled dose, stating that is what she was previously taking. She was advised that she would be prescribed xanax 2mg  BID x3 days only.   , MD General and Trauma Surgery [facility] Surgery

## 2019-06-28 NOTE — Progress Notes (Signed)
Kelly Garrett to be D/C'd  per MD order. Discussed with the patient and all questions fully answered.  VSS, Skin clean, dry and intact without evidence of skin break down, no evidence of skin tears noted.  IV catheter discontinued intact. Site without signs and symptoms of complications. Dressing and pressure applied.  An After Visit Summary was printed and given to the patient. Patient received prescription.  D/c education completed with patient/family including follow up instructions, medication list, d/c activities limitations if indicated, with other d/c instructions as indicated by MD - patient able to verbalize understanding, all questions fully answered.   Patient instructed to return to ED, call 911, or call MD for any changes in condition.   Patient to be escorted via WC, and D/C home via private auto.
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
